# Patient Record
Sex: Female | Born: 1985 | Race: Black or African American | Hispanic: No | Marital: Single | Smoking: Never smoker
Health system: Southern US, Community
[De-identification: ages and names within clinical notes are randomized; demographics above are authoritative.]

## PROBLEM LIST (undated history)

## (undated) DIAGNOSIS — N879 Dysplasia of cervix uteri, unspecified: Secondary | ICD-10-CM

## (undated) DIAGNOSIS — K219 Gastro-esophageal reflux disease without esophagitis: Secondary | ICD-10-CM

## (undated) DIAGNOSIS — O039 Complete or unspecified spontaneous abortion without complication: Secondary | ICD-10-CM

## (undated) DIAGNOSIS — J45909 Unspecified asthma, uncomplicated: Secondary | ICD-10-CM

## (undated) DIAGNOSIS — F32A Depression, unspecified: Secondary | ICD-10-CM

## (undated) DIAGNOSIS — Z8719 Personal history of other diseases of the digestive system: Secondary | ICD-10-CM

## (undated) DIAGNOSIS — D649 Anemia, unspecified: Secondary | ICD-10-CM

## (undated) DIAGNOSIS — Z8711 Personal history of peptic ulcer disease: Secondary | ICD-10-CM

## (undated) DIAGNOSIS — IMO0001 Reserved for inherently not codable concepts without codable children: Secondary | ICD-10-CM

## (undated) DIAGNOSIS — F329 Major depressive disorder, single episode, unspecified: Secondary | ICD-10-CM

## (undated) DIAGNOSIS — I8290 Acute embolism and thrombosis of unspecified vein: Secondary | ICD-10-CM

## (undated) HISTORY — DX: Depression, unspecified: F32.A

## (undated) HISTORY — DX: Anemia, unspecified: D64.9

## (undated) HISTORY — DX: Reserved for inherently not codable concepts without codable children: IMO0001

## (undated) HISTORY — DX: Dysplasia of cervix uteri, unspecified: N87.9

## (undated) HISTORY — DX: Complete or unspecified spontaneous abortion without complication: O03.9

## (undated) HISTORY — PX: DILATION AND CURETTAGE OF UTERUS: SHX78

## (undated) HISTORY — DX: Major depressive disorder, single episode, unspecified: F32.9

## (undated) HISTORY — DX: Acute embolism and thrombosis of unspecified vein: I82.90

## (undated) HISTORY — DX: Gastro-esophageal reflux disease without esophagitis: K21.9

## (undated) HISTORY — PX: INDUCED ABORTION: SHX677

---

## 2015-06-04 DIAGNOSIS — R87612 Low grade squamous intraepithelial lesion on cytologic smear of cervix (LGSIL): Secondary | ICD-10-CM | POA: Insufficient documentation

## 2017-04-09 ENCOUNTER — Emergency Department (HOSPITAL_COMMUNITY)
Admission: EM | Admit: 2017-04-09 | Discharge: 2017-04-09 | Disposition: A | Discharge status: Home / Self Care | Payer: Self-pay | Attending: Emergency Medicine | Admitting: Emergency Medicine

## 2017-04-09 ENCOUNTER — Other Ambulatory Visit: Payer: Self-pay

## 2017-04-09 ENCOUNTER — Encounter (HOSPITAL_COMMUNITY): Payer: Self-pay

## 2017-04-09 DIAGNOSIS — R490 Dysphonia: Secondary | ICD-10-CM | POA: Insufficient documentation

## 2017-04-09 DIAGNOSIS — Z79899 Other long term (current) drug therapy: Secondary | ICD-10-CM | POA: Insufficient documentation

## 2017-04-09 DIAGNOSIS — J45909 Unspecified asthma, uncomplicated: Secondary | ICD-10-CM | POA: Insufficient documentation

## 2017-04-09 HISTORY — DX: Unspecified asthma, uncomplicated: J45.909

## 2017-04-09 MED ORDER — PANTOPRAZOLE SODIUM 20 MG PO TBEC: 20.0000 mg | DELAYED_RELEASE_TABLET | Freq: Every day | ORAL | 0 refills | Status: DC

## 2017-04-09 NOTE — Discharge Instructions (Signed)
[facility] to establish primary care evaluation.  Follow-up with referred GI doctor for further evaluation and management of your GERD.  Take the Protonix as directed for GERD.  Return the emergency department for any fevers, difficulty breathing, difficulty swallowing, swelling of her neck or face or any other worsening or concerning symptoms.

## 2017-04-09 NOTE — ED Triage Notes (Signed)
Pt reports her voice has been going in and out for 3 weeks. Pt denies cough or illness. Reports she had one sharp pain in her throat but no sore throat. No redness or swelling noted. Airway intact.

## 2017-04-09 NOTE — ED Notes (Signed)
Declined W/C at D/C and was escorted to lobby by RN. 

## 2017-04-09 NOTE — ED Provider Notes (Signed)
[facility] EMERGENCY [facility]PART[facility] Provider Note   CSN: [id number] Arrival date & time: 04/09/17  1207     History   Chief Complaint Chief Complaint  Patient presents with  . Hoarse    HPI Makayla Russell is a 31 y.o. female who presents today for evaluation of intermittent hoarseness that has been ongoing for the last 2-3 weeks.  Patient reports that he will occasionally get better but then returned.  Patient denies any pain associated with the symptoms.  She denies any preceding nasal congestion, rhinorrhea, sore throat.  Patient reports that she did have an episode of throat pain today but states that that has resolved.  She has been able to eat and drink without any difficulty.  She is been able to tolerate her secretions without any difficulty.  Patient denies any fevers, recent weight loss, night sweats, difficulty swallowing, neck swelling, difficulty breathing.  Patient does have a history of GERD.  She was instructed to follow-up with GI for management of it but states that she never went and saw GI.  She is not on any medications for GERD.  The history is provided by the patient.    Past Medical History:  Diagnosis Date  . Asthma     There are no active problems to display for this patient.   History reviewed. No pertinent surgical history.  OB History    No data available       Home Medications    Prior to Admission medications   Medication Sig Start Date End Date Taking? Authorizing Provider  Throat Lozenges (COUGH DROPS MENTHOL MT) Use as directed 1-4 drops in the mouth or throat daily as needed (loss of voice).    Yes [provider]  pantoprazole (PROTONIX) 20 MG tablet Take 1 tablet (20 mg total) by mouth daily. 04/09/17 05/09/17  Maxwell CaulLayden, Lindsey A, PA-C    Family History History reviewed. No pertinent family history.  Social History Social History   Tobacco Use  . Smoking status: Never Smoker  . Smokeless tobacco: Never Used    Substance Use Topics  . Alcohol use: No    Frequency: Never  . Drug use: Not on file     Allergies   Patient has no known allergies.   Review of Systems Review of Systems  Constitutional: Negative for diaphoresis, fever and unexpected weight change.  HENT: Positive for voice change. Negative for facial swelling, sore throat and trouble swallowing.   Cardiovascular: Negative for chest pain.  Gastrointestinal: Negative for abdominal pain, nausea and vomiting.     Physical Exam Updated Vital Signs BP 134/71 (BP Location: Right Arm)   Pulse 89   Temp 98.7 F (37.1 C) (Oral)   Resp 16   LMP 03/24/2017 (Within Days)   SpO2 100%   Physical Exam  Constitutional: She appears well-developed and well-nourished.  HENT:  Head: Normocephalic and atraumatic.  Mouth/Throat: Uvula is midline, oropharynx is clear and moist and mucous membranes are normal. No trismus in the jaw.  Posterior oropharynx is clear.  No trismus.  Uvula is midline.  Airway is patent.  Very slight hoarseness on phonation  Eyes: Conjunctivae and EOM are normal. Right eye exhibits no discharge. Left eye exhibits no discharge. No scleral icterus.  Neck: Trachea normal and normal range of motion. Neck supple. No tracheal deviation and no edema present. No thyroid mass present.  No neck mass or swelling appreciated.  No abnormalities with swallowing.  Cardiovascular: Normal rate, regular rhythm and  normal pulses.  Pulmonary/Chest: Effort normal and breath sounds normal.  No evidence of respiratory distress. Able to speak in full sentences without difficulty.  Lymphadenopathy:    She has no cervical adenopathy.       Right: No supraclavicular adenopathy present.       Left: No supraclavicular adenopathy present.  Neurological: She is alert.  Skin: Skin is warm and dry.  Psychiatric: She has a normal mood and affect. Her speech is normal and behavior is normal.  Nursing note and vitals reviewed.    ED Treatments  / Results  Labs (all labs ordered are listed, but only abnormal results are displayed) Labs Reviewed - No data to display  EKG  EKG Interpretation None       Radiology No results found.  Procedures Procedures (including critical care time)  Medications Ordered in ED Medications - No data to display   Initial Impression / Assessment and Plan / ED Course  I have reviewed the triage vital signs and the nursing notes.  Pertinent labs & imaging results that were available during my care of the patient were reviewed by me and considered in my medical decision making (see chart for details).     31 year old female who presents for evaluation of 2-3 weeks of intermittent voice hoarseness.  States that symptoms come and go.  No associated fevers, nasal congestion, upper respiratory symptoms, difficulty breathing, difficulty swallowing.  Had one episode of pain in her throat today but otherwise denies any sore throat.  No history of fevers, night sweats, recent weight loss.  Patient does have a history of GERD for which she had [facility].  She was instructed to follow-up with a GI doctor for evaluation of symptoms but states that she never saw them.  She is not on any medications for GERD symptoms.  She states that she has been having intermittent GERD symptoms for the last several months. Patient is afebrile, non-toxic appearing, sitting comfortably on examination table. Vital signs reviewed and stable.  No evidence of respiratory distress.  O2 sats are 100% on room air.  No evidence of neck mass or swelling.  No evidence of thyroid mass.  Suspect that symptoms might be secondary to GERD and subtherapeutic management of symptoms.  History/physical exam is not concerning for neck mass, Ludwig angina, peritonsillar abscess..  Also consider thyroid issues.  No indication for acute emergent imaging at this time.  Plan to start patient on a PPI for symptomatic relief of GERD.  We will  plan to give outpatient GI follow-up.  Also instructed patient to establish primary care follow-up for evaluation of potential thyroid issues. Patient had ample opportunity for questions and discussion. All patient's questions were answered with full understanding. Strict return precautions discussed. Patient expresses understanding and agreement to plan.     Final Clinical Impressions(s) / ED Diagnoses   Final diagnoses:  Hoarseness of voice    ED Discharge Orders        Ordered    pantoprazole (PROTONIX) 20 MG tablet  Daily     04/09/17 1309       Rosana HoesLayden, Lindsey A, PA-C 04/10/17 1127    Margarita Grizzleay, Danielle, MD 04/10/17 1240

## 2017-04-27 ENCOUNTER — Encounter: Payer: Self-pay | Admitting: Gastroenterology

## 2017-06-14 ENCOUNTER — Ambulatory Visit: Payer: Self-pay | Admitting: Gastroenterology

## 2017-07-23 ENCOUNTER — Other Ambulatory Visit: Payer: Self-pay

## 2017-07-23 ENCOUNTER — Encounter (HOSPITAL_COMMUNITY): Payer: Self-pay | Admitting: Emergency Medicine

## 2017-07-23 DIAGNOSIS — J45909 Unspecified asthma, uncomplicated: Secondary | ICD-10-CM | POA: Insufficient documentation

## 2017-07-23 DIAGNOSIS — R1010 Upper abdominal pain, unspecified: Secondary | ICD-10-CM | POA: Insufficient documentation

## 2017-07-23 LAB — URINALYSIS, ROUTINE W REFLEX MICROSCOPIC
Bilirubin Urine: NEGATIVE
Glucose, UA: NEGATIVE mg/dL
Hgb urine dipstick: NEGATIVE
Ketones, ur: NEGATIVE mg/dL
Leukocytes, UA: NEGATIVE
Nitrite: NEGATIVE
Protein, ur: NEGATIVE mg/dL
Specific Gravity, Urine: 1.027 (ref 1.005–1.030)
pH: 5 (ref 5.0–8.0)

## 2017-07-23 LAB — COMPREHENSIVE METABOLIC PANEL
ALT: 9 U/L — ABNORMAL LOW (ref 14–54)
AST: 19 U/L (ref 15–41)
Albumin: 4.1 g/dL (ref 3.5–5.0)
Alkaline Phosphatase: 48 U/L (ref 38–126)
Anion gap: 9 (ref 5–15)
BUN: 7 mg/dL (ref 6–20)
CO2: 22 mmol/L (ref 22–32)
Calcium: 9.3 mg/dL (ref 8.9–10.3)
Chloride: 108 mmol/L (ref 101–111)
Creatinine, Ser: 0.79 mg/dL (ref 0.44–1.00)
GFR calc Af Amer: >60 mL/min (ref >60)
GFR calc non Af Amer: >60 mL/min (ref >60)
Glucose, Bld: 95 mg/dL (ref 65–99)
Potassium: 3.9 mmol/L (ref 3.5–5.1)
Sodium: 139 mmol/L (ref 135–145)
Total Bilirubin: 0.6 mg/dL (ref 0.3–1.2)
Total Protein: 7.9 g/dL (ref 6.5–8.1)

## 2017-07-23 LAB — I-STAT BETA HCG BLOOD, ED (MC, WL, AP ONLY): I-stat hCG, quantitative: <5 m[IU]/mL (ref <5)

## 2017-07-23 LAB — CBC
HCT: 33.1 % — ABNORMAL LOW (ref 36.0–46.0)
Hemoglobin: 10.8 g/dL — ABNORMAL LOW (ref 12.0–15.0)
MCH: 29.4 pg (ref 26.0–34.0)
MCHC: 32.6 g/dL (ref 30.0–36.0)
MCV: 90.2 fL (ref 78.0–100.0)
Platelets: 342 10*3/uL (ref 150–400)
RBC: 3.67 MIL/uL — ABNORMAL LOW (ref 3.87–5.11)
RDW: 15 % (ref 11.5–15.5)
WBC: 5.7 10*3/uL (ref 4.0–10.5)

## 2017-07-23 LAB — LIPASE, BLOOD: Lipase: 36 U/L (ref 11–51)

## 2017-07-23 NOTE — ED Triage Notes (Signed)
The patient said she has been having abdominal pain that is also on her side.  The patient said she has also been having acid reflux or burning in her stomach.  She has had nausea but no vomiting.  She has not taken anything for it and it is getting worse.  Patient denies urinary symptoms.  She rates her pain 10/10.

## 2017-07-24 ENCOUNTER — Emergency Department (HOSPITAL_COMMUNITY): Payer: Self-pay

## 2017-07-24 ENCOUNTER — Emergency Department (HOSPITAL_COMMUNITY)
Admission: EM | Admit: 2017-07-24 | Discharge: 2017-07-24 | Disposition: A | Discharge status: Home / Self Care | Payer: Self-pay | Attending: Emergency Medicine | Admitting: Emergency Medicine

## 2017-07-24 DIAGNOSIS — R1010 Upper abdominal pain, unspecified: Secondary | ICD-10-CM

## 2017-07-24 HISTORY — DX: Personal history of peptic ulcer disease: Z87.11

## 2017-07-24 HISTORY — DX: Personal history of other diseases of the digestive system: Z87.19

## 2017-07-24 LAB — WET PREP, GENITAL
Sperm: NONE SEEN
Trich, Wet Prep: NONE SEEN
Yeast Wet Prep HPF POC: NONE SEEN

## 2017-07-24 MED ORDER — GI COCKTAIL ~~LOC~~
30.0000 mL | Freq: Once | ORAL | Status: AC
  Administered 2017-07-24: 30 mL via ORAL
  Filled 2017-07-24: qty 30

## 2017-07-24 MED ORDER — OMEPRAZOLE 20 MG PO CPDR: 20.0000 mg | DELAYED_RELEASE_CAPSULE | Freq: Every day | ORAL | 0 refills | Status: DC

## 2017-07-24 MED ORDER — IOPAMIDOL (ISOVUE-300) INJECTION 61%
INTRAVENOUS | Status: AC
  Administered 2017-07-24: 100 mL
  Filled 2017-07-24: qty 100

## 2017-07-24 MED ORDER — SUCRALFATE 1 G PO TABS: 1.0000 g | ORAL_TABLET | Freq: Three times a day (TID) | ORAL | 0 refills | Status: DC

## 2017-07-24 NOTE — Discharge Instructions (Signed)
Follow-up with the stomach doctor as scheduled.  Avoid alcohol, NSAIDs, caffeine, spicy foods.  Take the stomach medications as prescribed.  Return to the ED if you develop new or worsening symptoms.

## 2017-07-24 NOTE — ED Provider Notes (Signed)
[facility] Provider Note   CSN: [id number] Arrival date & time: 07/23/17  2105     History   Chief Complaint Chief Complaint  Patient presents with  . Abdominal Pain    HPI Makayla Russell is a 32 y.o. female.  Patient presents with left-sided burning abdominal pain that has been ongoing for several months.  She reports the pain is constant and does not go away except for brief episodes at a time.  She has been seen for this pain multiple times at [facility] and told it was related to her acid reflux disease.  She states she was giv Protonix but that has not helped.  She has had nausea but no vomiting.  She came in tonight because she feels that the left-sided burning abdominal pain is progressively worsening and making her uncomfortable where she cannot do anything throughout the day.  She still wants to eat.  Her bowel movements have been normal.  She denies any pain with urination or blood in the urine.  No vaginal bleeding or discharge.  No previous abdominal surgeries.  She reports she had [place] in [place] several years ago that showed "healed ulcers".  She is due to see gastroenterology on the 25th of this month.   The history is provided by the patient.  Abdominal Pain   Associated symptoms include nausea. Pertinent negatives include fever, vomiting, dysuria, hematuria, arthralgias and myalgias.    Past Medical History:  Diagnosis Date  . Asthma   . History of stomach ulcers     There are no active problems to display for this patient.   History reviewed. No pertinent surgical history.  OB History    No data available       Home Medications    Prior to Admission medications   Medication Sig Start Date End Date Taking? Authorizing Provider  pantoprazole (PROTONIX) 20 MG tablet Take 1 tablet (20 mg total) by mouth daily. 04/09/17 05/09/17  Graciella FreerLayden, Lindsey A, PA-C  Throat Lozenges (COUGH DROPS MENTHOL MT) Use as directed 1-4 drops in  the mouth or throat daily as needed (loss of voice).     [provider]    Family History History reviewed. No pertinent family history.  Social History Social History   Tobacco Use  . Smoking status: Never Smoker  . Smokeless tobacco: Never Used  Substance Use Topics  . Alcohol use: No    Frequency: Never  . Drug use: No     Allergies   Patient has no known allergies.   Review of Systems Review of Systems  Constitutional: Positive for appetite change. Negative for fever.  HENT: Negative for congestion and rhinorrhea.   Eyes: Negative for visual disturbance.  Respiratory: Negative for cough, chest tightness and shortness of breath.   Cardiovascular: Negative for chest pain.  Gastrointestinal: Positive for abdominal pain and nausea. Negative for vomiting.  Genitourinary: Negative for dysuria, hematuria, vaginal bleeding and vaginal discharge.  Musculoskeletal: Negative for arthralgias and myalgias.  Skin: Negative for rash.  Neurological: Negative for dizziness, weakness and light-headedness.    all other systems are negative except as noted in the HPI and PMH.    Physical Exam Updated Vital Signs BP 126/71 (BP Location: Right Arm)   Pulse 83   Temp 98.5 F (36.9 C) (Oral)   Resp 16   LMP 07/02/2017   SpO2 100%   Physical Exam  Constitutional: She is oriented to person, place, and time. She appears  well-developed and well-nourished. No distress.  HENT:  Head: Normocephalic and atraumatic.  Mouth/Throat: Oropharynx is clear and moist. No oropharyngeal exudate.  Eyes: Conjunctivae and EOM are normal. Pupils are equal, round, and reactive to light.  Neck: Normal range of motion. Neck supple.  No meningismus.  Cardiovascular: Normal rate, regular rhythm, normal heart sounds and intact distal pulses.  No murmur heard. Pulmonary/Chest: Effort normal and breath sounds normal. No respiratory distress.  Abdominal: Soft. There is tenderness. There is no  rebound and no guarding.  Mild left upper and lower quadrant tenderness without voluntary guarding.  Abdomen is soft  Genitourinary:  Genitourinary Comments: Chaperone present.  Normal external genitalia.  Cervix is closed.  No CMT.  No lateralizing adnexal tenderness.  No midline uterine tenderness  Musculoskeletal: Normal range of motion. She exhibits no edema or tenderness.  No CVA tenderness  Neurological: She is alert and oriented to person, place, and time. No cranial nerve deficit. She exhibits normal muscle tone. Coordination normal.  No ataxia on finger to nose bilaterally. No pronator drift. 5/5 strength throughout. CN 2-12 intact.Equal grip strength. Sensation intact.   Skin: Skin is warm.  Psychiatric: She has a normal mood and affect. Her behavior is normal.  Nursing note and vitals reviewed.    ED Treatments / Results  Labs (all labs ordered are listed, but only abnormal results are displayed) Labs Reviewed  WET PREP, GENITAL - Abnormal; Notable for the following components:      Result Value   Clue Cells Wet Prep HPF POC PRESENT (*)    WBC, Wet Prep HPF POC MANY (*)    All other components within normal limits  COMPREHENSIVE METABOLIC PANEL - Abnormal; Notable for the following components:   ALT 9 (*)    All other components within normal limits  CBC - Abnormal; Notable for the following components:   RBC 3.67 (*)    Hemoglobin 10.8 (*)    HCT 33.1 (*)    All other components within normal limits  LIPASE, BLOOD  URINALYSIS, ROUTINE W REFLEX MICROSCOPIC  I-STAT BETA HCG BLOOD, ED ([facility], [facility], [facility] ONLY)  GC/CHLAMYDIA PROBE AMP ([place]) NOT AT [facility]    EKG  EKG Interpretation None       Radiology Ct Abdomen Pelvis W Contrast  Result Date: 07/24/2017 CLINICAL DATA:  Acute abdominal pain. EXAM: CT ABDOMEN AND PELVIS WITH CONTRAST TECHNIQUE: Multidetector CT imaging of the abdomen and pelvis was performed using the standard protocol following bolus  administration of intravenous contrast. CONTRAST:  ISOVUE-300 IOPAMIDOL (ISOVUE-300) INJECTION 61% COMPARISON:  None. FINDINGS: Lower chest: The lung bases are clear. Hepatobiliary: No focal liver abnormality is seen. No gallstones, gallbladder wall thickening, or biliary dilatation. Pancreas: No ductal dilatation or inflammation. Spleen: Normal in size without focal abnormality. Adrenals/Urinary Tract: Adrenal glands are unremarkable. Kidneys are normal, without renal calculi, focal lesion, or hydronephrosis. Bladder is unremarkable. Stomach/Bowel: Bowel evaluation is limited in the absence of enteric contrast and paucity of intra-abdominal fat. The stomach is nondistended. No bowel obstruction or evidence of inflammation. Air-filled normal appendix visualized in the right lower abdomen, for example image 59 series 3. Vascular/Lymphatic: Prominent periuterine and adnexal vascularity with dilatation of the ovarian veins. Normal caliber abdominal aorta. Limited sensitivity for adenopathy given lack of enteric contrast and paucity of body fat, no bulky adenopathy. Reproductive: Prominent uterine and adnexal vascularity with dilatation of the ovarian veins. Probable corpus luteal cyst in the right ovary. Small volume of free fluid in  the pelvis may be physiologic. Other: No free air or intra-abdominal abscess. Musculoskeletal: There are no acute or suspicious osseous abnormalities. IMPRESSION: 1. 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 2. Otherwise no acute abnormality. Electronically Signed   By: Rubye Oaks M.D.   On: 07/24/2017 02:38    Procedures Procedures (including critical care time)  Medications Ordered in ED Medications  gi cocktail (Maalox,Lidocaine,Donnatal) (not administered)     Initial Impression / Assessment and Plan / ED Course  I have reviewed the triage vital signs and the nursing  notes.  Pertinent labs & imaging results that were available during my care of the patient were reviewed by me and considered in my medical decision making (see chart for details).       Patient with several month history of left-sided burning abdominal pain with nausea.  She has been seen at outside hospitals multiple times for this without diagnosis.  Her labs today are reassuring but does show anemia.  She is not pregnant.  LFTs and lipase are normal.  Urinalysis is negative.  Record review shows patient had a CT scan done in May 2018 at [facility] that showed right ovarian thrombus.  Patient was seen by gynecology and treated for PID.  She has not since followed up.  She denies any right-sided abdominal pain today.  Labs reassuring. Anemia stable from previous values. CT today reassuring. Pelvic congestion syndrome noted but pelvic exam is benign. BV noted but patient without symptoms.  Discussed PPI, carafate, GI followup. Avoid alcohol, NSAIDs, caffeine, spicy foods. Return precautions discussed. Final Clinical Impressions(s) / ED Diagnoses   Final diagnoses:  Upper abdominal pain    ED Discharge Orders    None       Donyelle Enyeart, Jeannett Senior, MD 07/24/17 [phone]

## 2017-07-24 NOTE — ED Notes (Signed)
Patient transported to CT 

## 2017-07-24 NOTE — ED Notes (Signed)
ED Provider at bedside. 

## 2017-07-25 ENCOUNTER — Encounter: Payer: Self-pay | Admitting: Gastroenterology

## 2017-07-25 LAB — GC/CHLAMYDIA PROBE AMP (~~LOC~~) NOT AT ARMC
Chlamydia: NEGATIVE
Neisseria Gonorrhea: NEGATIVE

## 2017-07-28 ENCOUNTER — Encounter: Payer: Self-pay | Admitting: Gastroenterology

## 2017-08-01 ENCOUNTER — Encounter: Payer: Self-pay | Admitting: Gastroenterology

## 2017-08-01 ENCOUNTER — Ambulatory Visit (INDEPENDENT_AMBULATORY_CARE_PROVIDER_SITE_OTHER): Payer: Self-pay | Admitting: Gastroenterology

## 2017-08-01 VITALS — BP 104/70 | HR 84 | Ht 66.25 in | Wt 132.0 lb

## 2017-08-01 DIAGNOSIS — R1310 Dysphagia, unspecified: Secondary | ICD-10-CM

## 2017-08-01 DIAGNOSIS — R1012 Left upper quadrant pain: Secondary | ICD-10-CM

## 2017-08-01 DIAGNOSIS — R110 Nausea: Secondary | ICD-10-CM

## 2017-08-01 DIAGNOSIS — R490 Dysphonia: Secondary | ICD-10-CM

## 2017-08-01 MED ORDER — ESOMEPRAZOLE MAGNESIUM 40 MG PO PACK: 40.0000 mg | PACK | Freq: Every day | ORAL | 3 refills | Status: DC

## 2017-08-01 NOTE — ED Notes (Signed)
08/01/2017, Spoke with pt. Concerning her STD results and wet prep results. All Questions answered.  Informed pt. If she had developed any symptoms, to come back to ED for Evaluation or follow-up with her own PCP.  Pt. Verbalized understanding .

## 2017-08-01 NOTE — Progress Notes (Signed)
Makayla Russell    [id number]    Nov 14, 1985  Primary Care Physician:Patient, No Pcp Per  Referring Physician: No referring provider defined for this encounter.  Chief complaint:  LUQ abdominal pain, Nausea, hoarseness of voice  HPI: 32 year old African-American female is here with complaints of  left upper quadrant pain associated with nausea and hoarseness of voice.  She has been having severe abdominal pain since 2018 on and off, no improvement with Prilosec or carafate. She feels they make symptoms worse, when she swallows the pills it burns more. She has associated hoarseness of voice and nausea. Pain is predominantly in LUQ of abd. She says that she has a "weird sensation with swallowing", patient was unable to elaborate more, denied food getting hung up but has burning sensation with swallowing. She is eating less and also thinks she has lost weight. No black stool or blood per rectum. She has irregular bowel habits, has BM approx once every 2 days. [facility] , based on chart review she presented to ER 5 times within in the past 15 months with similar complaints.  Most recent ER visit July 24, 2017 with complaints of left upper quadrant abdominal pain. [place] ?showed gastric ulcers. She was treated for H.pylori at the time.  Anemia post pregnancy is on Iron pills   Outpatient Encounter Medications as of 08/01/2017  Medication Sig  . albuterol (PROVENTIL HFA;VENTOLIN HFA) 108 (90 Base) MCG/ACT inhaler Inhale 1-2 puffs into the lungs every 6 (six) hours as needed for wheezing or shortness of breath.  Marland Kitchen. omeprazole (PRILOSEC) 20 MG capsule Take 1 capsule (20 mg total) by mouth daily.  . sucralfate (CARAFATE) 1 g tablet Take 1 tablet (1 g total) by mouth 4 (four) times daily -  with meals and at bedtime.  . Throat Lozenges (COUGH DROPS MENTHOL MT) Use as directed  1 drop in the mouth or throat daily as needed (loss of voice).    No facility-administered encounter medications on file as of 08/01/2017.     Allergies as of 08/01/2017  . (No Known Allergies)    Past Medical History:  Diagnosis Date  . Asthma   . History of stomach ulcers     No past surgical history on file.  No family history on file.  Social History   Socioeconomic History  . Marital status: Single    Spouse name: Not on file  . Number of children: Not on file  . Years of education: Not on file  . Highest education level: Not on file  Occupational History  . Not on file  Social Needs  . Financial resource strain: Not on file  . Food insecurity:    Worry: Not on file    Inability: Not on file  . Transportation needs:    Medical: Not on file    Non-medical: Not on file  Tobacco Use  . Smoking status: Never Smoker  . Smokeless tobacco: Never Used  Substance and Sexual Activity  . Alcohol use: No    Frequency: Never  . Drug use: No  . Sexual activity: Not on file  Lifestyle  . Physical activity:    Days per week: Not on file    Minutes per session: Not on file  . Stress: Not on file  Relationships  . Social connections:    Talks  on phone: Not on file    Gets together: Not on file    Attends religious service: Not on file    Active member of club or organization: Not on file    Attends meetings of clubs or organizations: Not on file    Relationship status: Not on file  . Intimate partner violence:    Fear of current or ex partner: Not on file    Emotionally abused: Not on file    Physically abused: Not on file    Forced sexual activity: Not on file  Other Topics Concern  . Not on file  Social History Narrative  . Not on file      Review of systems: Review of Systems  Constitutional: Negative for fever and chills. Positive for fatigue HENT: Negative.   Eyes: Negative for blurred vision.  Respiratory: Negative for cough, shortness of breath and  wheezing.   Cardiovascular: Negative for chest pain and palpitations.  Gastrointestinal: as per HPI Genitourinary: Negative for dysuria, urgency, frequency and hematuria.  Musculoskeletal: Positive for myalgias, back pain and joint pain.  Skin: Negative for itching and occasional rash associated with dry skin.  Neurological: Negative for dizziness, tremors, focal weakness, seizures and loss of consciousness. Positive for headaches Endo/Heme/Allergies: Positive for seasonal allergies.  Psychiatric/Behavioral: Negative for depression, suicidal ideas and hallucinations. Positive for depression All other systems reviewed and are negative.   Physical Exam: Vitals:   08/01/17 1336  BP: 104/70  Pulse: 84   Body mass index is 21.14 kg/m. Gen:      No acute distress HEENT:  EOMI, sclera anicteric Neck:     No masses; no thyromegaly Lungs:    Clear to auscultation bilaterally; normal respiratory effort CV:         Regular rate and rhythm; no murmurs Abd:      + bowel sounds; soft, non-tender; no palpable masses, no distension Ext:    No edema; adequate peripheral perfusion Skin:      Warm and dry; no rash Neuro: alert and oriented x 3 Psych: normal mood and affect  Data Reviewed:  Reviewed labs, radiology imaging, old records and pertinent past GI work up  CT abdomen pelvis with contrast July 24, 2017 FINDINGS: Lower chest: The lung bases are clear.  Hepatobiliary: No focal liver abnormality is seen. No gallstones, gallbladder wall thickening, or biliary dilatation.  Pancreas: No ductal dilatation or inflammation.  Spleen: Normal in size without focal abnormality.  Adrenals/Urinary Tract: Adrenal glands are unremarkable. Kidneys are normal, without renal calculi, focal lesion, or hydronephrosis. Bladder is unremarkable.  Stomach/Bowel: Bowel evaluation is limited in the absence of enteric contrast and paucity of intra-abdominal fat. The stomach is nondistended. No  bowel obstruction or evidence of inflammation. Air-filled normal appendix visualized in the right lower abdomen, for example image 59 series 3.  Vascular/Lymphatic: Prominent periuterine and adnexal vascularity with dilatation of the ovarian veins. Normal caliber abdominal aorta. Limited sensitivity for adenopathy given lack of enteric contrast and paucity of body fat, no bulky adenopathy.  Reproductive: Prominent uterine and adnexal vascularity with dilatation of the ovarian veins. Probable corpus luteal cyst in the right ovary. Small volume of free fluid in the pelvis may be physiologic.  Other: No free air or intra-abdominal abscess.  Musculoskeletal: There are no acute or suspicious osseous abnormalities.  IMPRESSION: 1. 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 2. Otherwise no acute abnormality.  CT abdomen and  pelvis Sep 28, 2016 done at [facility] Inflammatory appearing findings in the lower pelvis favored to represent pelvic inflammatory disease or similar process.  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  Will switch to Nexium powder packet 40 mg daily instead of capsule as she has more burning sensation in her throat when she swallows capsules Schedule for EGD for evaluation The risks and benefits as well as alternatives of endoscopic procedure(s) have been discussed and reviewed. All questions answered. The patient agrees to proceed.    Iona Beard , MD [phone] Mon-Fri 8a-5p [phone] after 5p, weekends, holidays  CC: No ref. provider found

## 2017-08-01 NOTE — Patient Instructions (Signed)
If you are age 33 or older, your body mass index should be between 23-30. Your Body mass index is 21.14 kg/m. If this is out of the aforementioned range listed, please consider follow up with your Primary Care Provider.  If you are age 33 or younger, your body mass index should be between 19-25. Your Body mass index is 21.14 kg/m. If this is out of the aformentioned range listed, please consider follow up with your Primary Care Provider.   We have sent the following medications to your pharmacy for you to pick up at your convenience: Nexium 40mg  packet by mouth once daily.   It has been recommended to you by your physician that you have a(n) endoscopy completed. Per your request, we did not schedule the procedure(s) today due to financial assistance form needing to be filled out and returned . Please contact our office at [phone] should you decide to have the procedure completed.  Please follow up with your gynecologist as recommended.

## 2017-08-01 NOTE — ED Notes (Signed)
Pt. Called and left a message on this RN's voicemail concerning test results.  Called the pt. Back and left a message for a return call. 08/01/2017, 13:42

## 2017-08-09 ENCOUNTER — Encounter: Payer: Self-pay | Admitting: Gastroenterology

## 2017-08-22 ENCOUNTER — Ambulatory Visit (INDEPENDENT_AMBULATORY_CARE_PROVIDER_SITE_OTHER): Payer: Self-pay | Admitting: Nurse Practitioner

## 2017-08-22 ENCOUNTER — Other Ambulatory Visit: Payer: Self-pay

## 2017-08-22 ENCOUNTER — Encounter (INDEPENDENT_AMBULATORY_CARE_PROVIDER_SITE_OTHER): Payer: Self-pay | Admitting: Nurse Practitioner

## 2017-08-22 VITALS — BP 117/71 | HR 85 | Temp 98.1°F | Ht 66.25 in | Wt 132.6 lb

## 2017-08-22 DIAGNOSIS — B9689 Other specified bacterial agents as the cause of diseases classified elsewhere: Secondary | ICD-10-CM

## 2017-08-22 DIAGNOSIS — R1012 Left upper quadrant pain: Secondary | ICD-10-CM

## 2017-08-22 DIAGNOSIS — N760 Acute vaginitis: Secondary | ICD-10-CM

## 2017-08-22 MED ORDER — METRONIDAZOLE 500 MG PO TABS: 500.0000 mg | ORAL_TABLET | Freq: Two times a day (BID) | ORAL | 0 refills | Status: AC

## 2017-08-22 NOTE — Patient Instructions (Signed)
Food Choices for Gastroesophageal Reflux Disease, Adult When you have gastroesophageal reflux disease (GERD), the foods you eat and your eating habits are very important. Choosing the right foods can help ease your discomfort. What guidelines do I need to follow?  Choose fruits, vegetables, whole grains, and low-fat dairy products.  Choose low-fat meat, fish, and poultry.  Limit fats such as oils, salad dressings, butter, nuts, and avocado.  Keep a food diary. This helps you identify foods that cause symptoms.  Avoid foods that cause symptoms. These may be different for everyone.  Eat small meals often instead of 3 large meals a day.  Eat your meals slowly, in a place where you are relaxed.  Limit fried foods.  Cook foods using methods other than frying.  Avoid drinking alcohol.  Avoid drinking large amounts of liquids with your meals.  Avoid bending over or lying down until 2-3 hours after eating. What foods are not recommended? These are some foods and drinks that may make your symptoms worse: Vegetables Tomatoes. Tomato juice. Tomato and spaghetti sauce. Chili peppers. Onion and garlic. Horseradish. Fruits Oranges, grapefruit, and lemon (fruit and juice). Meats High-fat meats, fish, and poultry. This includes hot dogs, ribs, ham, sausage, salami, and bacon. Dairy Whole milk and chocolate milk. Sour cream. Cream. Butter. Ice cream. Cream cheese. Drinks Coffee and tea. Bubbly (carbonated) drinks or energy drinks. Condiments Hot sauce. Barbecue sauce. Sweets/Desserts Chocolate and cocoa. Donuts. Peppermint and spearmint. Fats and Oils High-fat foods. This includes [place] fries and potato chips. Other Vinegar. Strong spices. This includes black pepper, white pepper, red pepper, cayenne, curry powder, cloves, ginger, and chili powder. The items listed above may not be a complete list of foods and drinks to avoid. Contact your dietitian for more information. This  information is not intended to replace advice given to you by your health care provider. Make sure you discuss any questions you have with your health care provider. Document Released: 10/26/2011 Document Revised: 10/02/2015 Document Reviewed: 02/28/2013 Elsevier Interactive Patient Education  2017 Elsevier Inc.  Heartburn Heartburn is a type of pain or discomfort that can happen in the throat or chest. It is often described as a burning pain. It may also cause a bad taste in the mouth. Heartburn may feel worse when you lie down or bend over. It may be caused by stomach contents that move back up (reflux) into the tube that connects the mouth with the stomach (esophagus). Follow these instructions at home: Take these actions to lessen your discomfort and to help avoid problems. Diet  Follow a diet as told by your doctor. You may need to avoid foods and drinks such as: ? Coffee and tea (with or without caffeine). ? Drinks that contain alcohol. ? Energy drinks and sports drinks. ? Carbonated drinks or sodas. ? Chocolate and cocoa. ? Peppermint and mint flavorings. ? Garlic and onions. ? Horseradish. ? Spicy and acidic foods, such as peppers, chili powder, curry powder, vinegar, hot sauces, and BBQ sauce. ? Citrus fruit juices and citrus fruits, such as oranges, lemons, and limes. ? Tomato-based foods, such as red sauce, chili, salsa, and pizza with red sauce. ? Fried and fatty foods, such as donuts, french fries, potato chips, and high-fat dressings. ? High-fat meats, such as hot dogs, rib eye steak, sausage, ham, and bacon. ? High-fat dairy items, such as whole milk, butter, and cream cheese.  Eat small meals often. Avoid eating large meals.  Avoid drinking large amounts of liquid with your  meals.  Avoid eating meals during the 2-3 hours before bedtime.  Avoid lying down right after you eat.  Do not exercise right after you eat. General instructions  Pay attention to any changes  in your symptoms.  Take over-the-counter and prescription medicines only as told by your doctor. Do not take aspirin, ibuprofen, or other NSAIDs unless your doctor says it is okay.  Do not use any tobacco products, including cigarettes, chewing tobacco, and e-cigarettes. If you need help quitting, ask your doctor.  Wear loose clothes. Do not wear anything tight around your waist.  Raise (elevate) the head of your bed about 6 inches (15 cm).  Try to lower your stress. If you need help doing this, ask your doctor.  If you are overweight, lose an amount of weight that is healthy for you. Ask your doctor about a safe weight loss goal.  Keep all follow-up visits as told by your doctor. This is important. Contact a doctor if:  You have new symptoms.  You lose weight and you do not know why it is happening.  You have trouble swallowing, or it hurts to swallow.  You have wheezing or a cough that keeps happening.  Your symptoms do not get better with treatment.  You have heartburn often for more than two weeks. Get help right away if:  You have pain in your arms, neck, jaw, teeth, or back.  You feel sweaty, dizzy, or light-headed.  You have chest pain or shortness of breath.  You throw up (vomit) and your throw up looks like blood or coffee grounds.  Your poop (stool) is bloody or black. This information is not intended to replace advice given to you by your health care provider. Make sure you discuss any questions you have with your health care provider. Document Released: 01/06/2011 Document Revised: 10/02/2015 Document Reviewed: 08/21/2014 Elsevier Interactive Patient Education  [employer]2018 Elsevier Inc.

## 2017-08-22 NOTE — Progress Notes (Signed)
Assessment & Plan:  Makayla Russell was seen today for hospitalization follow-up.  Diagnoses and all orders for this visit:  Left upper quadrant pain EGD pending PAP pending  Bacterial vaginosis -     metroNIDAZOLE (FLAGYL) 500 MG tablet; Take 1 tablet (500 mg total) by mouth 2 (two) times daily for 7 days.    Patient has been counseled on age-appropriate routine health concerns for screening and prevention. These are reviewed and up-to-date. Referrals have been placed accordingly. Immunizations are up-to-date or declined.    Subjective:   Chief Complaint  Patient presents with  . Hospitalization Follow-up    upper abdominal pain   HPI Makayla Russell 32 y.o. female presents to office today for hospital follow up.   Abdominal Pain She was seen in the ED on 08-01-2017 for LUQ abdominal pain. She has a history of GERD with ulcers but reportedly protonix did not provide relief of her pain. She has an appointment scheduled with GI on 09-01-2017 and has an endoscopy scheduled. 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  She does report she follow-up with oncology thereafter.  She also states she had a colposcopy due to abnormal Pap a few years ago but no follow-up. CT during ED admission showed pelvic congestion syndrome, right ovarian cyst and labs did show bacterial vaginosis which I will treat patient for today.  She was instructed to continue PPI, Carafate and instructed to follow-up with GI. Today she continues to endorse LUQ pain despite medication compliance. She needs to see GI and have PAP performed. She would like to check with the Health Department first to see if she can get a PAP sooner than what we can schedule here at the clinic.    Review of Systems  Constitutional: Negative for fever, malaise/fatigue and weight loss.  HENT: Negative.  Negative for nosebleeds.   Eyes: Negative.  Negative for blurred vision, double vision and  photophobia.  Respiratory: Negative.  Negative for cough and shortness of breath.   Cardiovascular: Negative.  Negative for chest pain, palpitations and leg swelling.  Gastrointestinal: Positive for abdominal pain. Negative for heartburn, nausea and vomiting.  Musculoskeletal: Negative.  Negative for myalgias.  Neurological: Negative.  Negative for dizziness, focal weakness, seizures and headaches.  Psychiatric/Behavioral: Negative.  Negative for suicidal ideas.    Past Medical History:  Diagnosis Date  . Abortion    x 2  . Anemia   . Asthma   . Clot    in ovary vein 2018  . Depression   . History of stomach ulcers     Past Surgical History:  Procedure Laterality Date  . NO PAST SURGERIES      Family History  Problem Relation Age of Onset  . Hypertension Mother   . Tuberculosis Maternal Grandmother     Social History Reviewed with no changes to be made today.   Outpatient Medications Prior to Visit  Medication Sig Dispense Refill  . albuterol (PROVENTIL HFA;VENTOLIN HFA) 108 (90 Base) MCG/ACT inhaler Inhale 1-2 puffs into the lungs every 6 (six) hours as needed for wheezing or shortness of breath.    . esomeprazole (NEXIUM) 40 MG packet Take 40 mg by mouth daily before breakfast. 30 each 3  . omeprazole (PRILOSEC) 20 MG capsule Take 1 capsule (20 mg total) by mouth daily. 30 capsule 0  . sucralfate (CARAFATE) 1 g tablet Take 1 tablet (1 g total) by mouth 4 (four) times  daily -  with meals and at bedtime. 30 tablet 0  . Throat Lozenges (COUGH DROPS MENTHOL MT) Use as directed 1 drop in the mouth or throat daily as needed (loss of voice).      No facility-administered medications prior to visit.     No Known Allergies     Objective:    BP 117/71 (BP Location: Left Arm, Patient Position: Sitting, Cuff Size: Normal)   Pulse 85   Temp 98.1 F (36.7 C) (Oral)   Ht 5' 6.25" (1.683 m)   Wt 132 lb 9.6 oz (60.1 kg)   LMP 08/22/2017 (Exact Date)   SpO2 100%   BMI 21.24  kg/m  Wt Readings from Last 3 Encounters:  08/22/17 132 lb 9.6 oz (60.1 kg)  08/01/17 132 lb (59.9 kg)    Physical Exam  Constitutional: She is oriented to person, place, and time. She appears well-developed and well-nourished. She is cooperative.  HENT:  Head: Normocephalic and atraumatic.  Eyes: EOM are normal.  Neck: Normal range of motion.  Cardiovascular: Normal rate, regular rhythm and normal heart sounds. Exam reveals no gallop and no friction rub.  No murmur heard. Pulmonary/Chest: Effort normal and breath sounds normal. No tachypnea. No respiratory distress. She has no decreased breath sounds. She has no wheezes. She has no rhonchi. She has no rales. She exhibits no tenderness.  Abdominal: Soft. Bowel sounds are normal. She exhibits no distension and no mass. There is tenderness (LUQ). There is no rebound and no guarding. No hernia.  Musculoskeletal: Normal range of motion. She exhibits no edema.  Neurological: She is alert and oriented to person, place, and time. Coordination normal.  Skin: Skin is warm and dry.  Psychiatric: She has a normal mood and affect. Her behavior is normal. Judgment and thought content normal.  Nursing note and vitals reviewed.     Patient has been counseled extensively about nutrition and exercise as well as the importance of adherence with medications and regular follow-up. The patient was given clear instructions to go to ER or return to medical center if symptoms don't improve, worsen or new problems develop. The patient verbalized understanding.   Follow-up: Return if symptoms worsen or fail to improve.   Claiborne Rigg, FNP-BC [facility] [place], [place] [phone]   08/22/2017, 5:16 PM

## 2017-08-25 ENCOUNTER — Other Ambulatory Visit: Payer: Self-pay

## 2017-08-25 ENCOUNTER — Ambulatory Visit (AMBULATORY_SURGERY_CENTER): Payer: Self-pay | Admitting: *Deleted

## 2017-08-25 VITALS — Ht 66.0 in | Wt 131.0 lb

## 2017-08-25 DIAGNOSIS — R1012 Left upper quadrant pain: Secondary | ICD-10-CM

## 2017-08-25 NOTE — Progress Notes (Signed)
No egg or soy allergy known to patient  No issues with past sedation with any surgeries  or procedures, no intubation problems  No diet pills per patient No home 02 use per patient  No blood thinners per patient  Pt denies issues with constipation  No A fib or A flutter  EMMI video sent to pt's e mail  

## 2017-09-01 ENCOUNTER — Encounter: Payer: Self-pay | Admitting: Gastroenterology

## 2017-09-01 ENCOUNTER — Other Ambulatory Visit: Payer: Self-pay

## 2017-09-01 ENCOUNTER — Ambulatory Visit (AMBULATORY_SURGERY_CENTER): Payer: Self-pay | Admitting: Gastroenterology

## 2017-09-01 VITALS — BP 129/80 | HR 75 | Temp 97.5°F | Resp 11 | Ht 66.0 in | Wt 131.0 lb

## 2017-09-01 DIAGNOSIS — R1012 Left upper quadrant pain: Secondary | ICD-10-CM

## 2017-09-01 DIAGNOSIS — K3189 Other diseases of stomach and duodenum: Secondary | ICD-10-CM

## 2017-09-01 MED ORDER — SODIUM CHLORIDE 0.9 % IV SOLN: 500.0000 mL | Freq: Once | INTRAVENOUS | Status: DC

## 2017-09-01 NOTE — Op Note (Signed)
[facility] Patient Name: Makayla Russell Procedure Date: 09/01/2017 10:14 AM MRN: [id number] Endoscopist: Napoleon FormKavitha V. Ajeet Casasola , MD Age: 32 Referring MD:  Date of Birth: 1985-09-22 Gender: Female Account #: [id number] Procedure:                Upper GI endoscopy Indications:              Epigastric abdominal pain Medicines:                Monitored Anesthesia Care Procedure:                Pre-Anesthesia Assessment:                           - Prior to the procedure, a History and Physical                            was performed, and patient medications and                            allergies were reviewed. The patient's tolerance of                            previous anesthesia was also reviewed. The risks                            and benefits of the procedure and the sedation                            options and risks were discussed with the patient.                            All questions were answered, and informed consent                            was obtained. Prior Anticoagulants: The patient has                            taken no previous anticoagulant or antiplatelet                            agents. ASA Grade Assessment: II - A patient with                            mild systemic disease. After reviewing the risks                            and benefits, the patient was deemed in                            satisfactory condition to undergo the procedure.                           After obtaining informed consent, the endoscope was  passed under direct vision. Throughout the                            procedure, the patient's blood pressure, pulse, and                            oxygen saturations were monitored continuously. The                            Endoscope was introduced through the mouth, and                            advanced to the second part of duodenum. The upper                            GI endoscopy was  accomplished without difficulty.                            The patient tolerated the procedure well. Scope In: Scope Out: Findings:                 The Z-line was regular and was found 38 cm from the                            incisors. Esophagus appeared normal.                           The entire examined stomach was normal. Biopsies                            were taken with a cold forceps for Helicobacter                            pylori testing.                           A few localized erosions without bleeding were                            found in the duodenal bulb. second part of duodenum                            appeared normal. Biopsies were taken with a cold                            forceps for histology. Complications:            No immediate complications. Estimated Blood Loss:     Estimated blood loss was minimal. Impression:               - Z-line regular, 38 cm from the incisors.                           - Normal stomach. Biopsied.                           -  Duodenal erosions without bleeding. Biopsied. Recommendation:           - Resume previous diet.                           - Continue present medications.                           - Await pathology results.                           - No aspirin, ibuprofen, naproxen, or other                            non-steroidal anti-inflammatory drugs. Napoleon Form, MD 09/01/2017 10:43:10 AM This report has been signed electronically.

## 2017-09-01 NOTE — Patient Instructions (Signed)
YOU HAD AN ENDOSCOPIC PROCEDURE TODAY AT THE [facility]:   Refer to the procedure report that was given to you for any specific questions about what was found during the examination.  If the procedure report does not answer your questions, please call your gastroenterologist to clarify.  If you requested that your care partner not be given the details of your procedure findings, then the procedure report has been included in a sealed envelope for you to review at your convenience later.  YOU SHOULD EXPECT: Some feelings of bloating in the abdomen. Passage of more gas than usual.  Walking can help get rid of the air that was put into your GI tract during the procedure and reduce the bloating. If you had a lower endoscopy (such as a colonoscopy or flexible sigmoidoscopy) you may notice spotting of blood in your stool or on the toilet paper. If you underwent a bowel prep for your procedure, you may not have a normal bowel movement for a few days.  Please Note:  You might notice some irritation and congestion in your nose or some drainage.  This is from the oxygen used during your procedure.  There is no need for concern and it should clear up in a day or so.  SYMPTOMS TO REPORT IMMEDIATELY:    Following upper endoscopy (EGD)  Vomiting of blood or coffee ground material  New chest pain or pain under the shoulder blades  Painful or persistently difficult swallowing  New shortness of breath  Fever of 100F or higher  Black, tarry-looking stools  For urgent or emergent issues, a gastroenterologist can be reached at any hour by calling [phone].   DIET:  We do recommend a small meal at first, but then you may proceed to your regular diet.  Drink plenty of fluids but you should avoid alcoholic beverages for 24 hours.  ACTIVITY:  You should plan to take it easy for the rest of today and you should NOT DRIVE or use heavy machinery until tomorrow (because of the sedation medicines used  during the test).    FOLLOW UP: Our staff will call the number listed on your records the next business day following your procedure to check on you and address any questions or concerns that you may have regarding the information given to you following your procedure. If we do not reach you, we will leave a message.  However, if you are feeling well and you are not experiencing any problems, there is no need to return our call.  We will assume that you have returned to your regular daily activities without incident.  If any biopsies were taken you will be contacted by phone or by letter within the next 1-3 weeks.  Please call us at [phone] if you have not heard about the biopsies in 3 weeks.    SIGNATURES/CONFIDENTIALITY: You and/or your care partner have signed paperwork which will be entered into your electronic medical record.  These signatures attest to the fact that that the information above on your After Visit Summary has been reviewed and is understood.  Full responsibility of the confidentiality of this discharge information lies with you and/or your care-partner.  NO aspirin, naproxen or other non steroidal anti inflammatory meds.

## 2017-09-01 NOTE — Progress Notes (Signed)
Report to PACU, RN, vss, BBS= Clear.  

## 2017-09-01 NOTE — Progress Notes (Signed)
Called to room to assist during endoscopic procedure.  Patient ID and intended procedure confirmed with present staff. Received instructions for my participation in the procedure from the performing physician.  

## 2017-09-01 NOTE — Progress Notes (Signed)
Pt's states no medical or surgical changes since previsit or office visit. 

## 2017-09-02 ENCOUNTER — Telehealth: Payer: Self-pay | Admitting: *Deleted

## 2017-09-02 NOTE — Telephone Encounter (Signed)
  Follow up Call-  Call back number 09/01/2017  Post procedure Call Back phone  # [phone]  Permission to leave phone message Yes    Message full; unable to leave message

## 2017-09-08 ENCOUNTER — Telehealth: Payer: Self-pay

## 2017-09-08 ENCOUNTER — Other Ambulatory Visit: Payer: Self-pay

## 2017-09-08 DIAGNOSIS — R1012 Left upper quadrant pain: Secondary | ICD-10-CM

## 2017-09-08 MED ORDER — BISMUTH SUBSALICYLATE 262 MG PO CHEW: 524.0000 mg | CHEWABLE_TABLET | Freq: Three times a day (TID) | ORAL | 0 refills | Status: AC

## 2017-09-08 MED ORDER — METRONIDAZOLE 250 MG PO TABS: 250.0000 mg | ORAL_TABLET | Freq: Three times a day (TID) | ORAL | 0 refills | Status: AC

## 2017-09-08 MED ORDER — DOXYCYCLINE HYCLATE 100 MG PO CAPS: 100.0000 mg | ORAL_CAPSULE | Freq: Two times a day (BID) | ORAL | 0 refills | Status: AC

## 2017-09-08 NOTE — Telephone Encounter (Signed)
Ok please get her samples for PPI during the period of therapy, She will need to be on PPI for 14 days along with antibiotics. Thanks

## 2017-09-08 NOTE — Telephone Encounter (Signed)
Spoke with patient. She expresses understanding of the results and the plan of care. She is presently a cash pay patient. Asks for the least expensive option. She states she uses discount cards to bring down her costs. Patient is on Nexium. Complaining of a headache that occurs after she takes Nexium. She has also had diarrhea which she attributes to Nexium. We could provide samples of Dexilant for the duration of her h pylori eradication therapy if you feel this is an option. Please advise.

## 2017-09-08 NOTE — Telephone Encounter (Signed)
-----   Message from Napoleon Form, MD sent at 09/08/2017  6:28 AM EDT -----  H.pylori positive. Please send prescription for Pylera or Bismuth  and Flagyl  1 tablet four times daily, Doxycycline  1 capsule Twice daily X 10 days along with high dose PPI (omeprazole or Nexium  BID. Will need to confirm eradication in 6 weeks by checking H.pylori stool Ag, off PPI for 2 weeks prior to the test.

## 2017-09-08 NOTE — Telephone Encounter (Signed)
Patient states she is needing to speak with [facility] again. Best call back # [phone]

## 2017-09-08 NOTE — Telephone Encounter (Signed)
With discount card, Pylera is over $900.00 Will send in the separate prescriptions. Patient will let me know if she is able to afford the treatment this way.

## 2017-09-21 ENCOUNTER — Telehealth: Payer: Self-pay | Admitting: Gastroenterology

## 2017-09-21 ENCOUNTER — Other Ambulatory Visit: Payer: Self-pay

## 2017-09-21 MED ORDER — BIS SUBCIT-METRONID-TETRACYC 140-125-125 MG PO CAPS: 3.0000 | ORAL_CAPSULE | Freq: Three times a day (TID) | ORAL | 0 refills | Status: DC

## 2017-09-21 MED ORDER — DEXLANSOPRAZOLE 60 MG PO CPDR: 60.0000 mg | DELAYED_RELEASE_CAPSULE | Freq: Two times a day (BID) | ORAL | 0 refills | Status: DC

## 2017-09-21 NOTE — Telephone Encounter (Signed)
Patient has + h pylori infection. New rx for Pylera and Rx for Dexilant sent to [facility].

## 2017-09-27 ENCOUNTER — Telehealth: Payer: Self-pay | Admitting: Gastroenterology

## 2017-09-27 ENCOUNTER — Other Ambulatory Visit: Payer: Self-pay

## 2017-09-27 MED ORDER — BIS SUBCIT-METRONID-TETRACYC 140-125-125 MG PO CAPS: 3.0000 | ORAL_CAPSULE | Freq: Three times a day (TID) | ORAL | 0 refills | Status: DC

## 2017-09-27 NOTE — Telephone Encounter (Signed)
Patient calling back wanting to know if she could just have pylera medication prescribed to pharmacy or does she need another ov. See phone note from 5.2.19.

## 2017-09-27 NOTE — Telephone Encounter (Signed)
Pt just got [employer] is requesting prescription for pylera combo. She stated that she was given something different before because she did not have insurance and could not afford pylera but the medication that she is taking is not helping. She uses [facility] on [address].

## 2017-09-27 NOTE — Telephone Encounter (Signed)
Cancel the Pylera Rx. Patient purchased the Doxycycline and Flagyl to complete the treatment. She will plan for testing for eradication of H Pylori in 4 weeks.

## 2017-10-04 ENCOUNTER — Ambulatory Visit (INDEPENDENT_AMBULATORY_CARE_PROVIDER_SITE_OTHER): Payer: Self-pay | Admitting: Nurse Practitioner

## 2017-10-05 ENCOUNTER — Telehealth: Payer: Self-pay | Admitting: Gastroenterology

## 2017-10-06 NOTE — Telephone Encounter (Signed)
Not due for retesting yet. She understands she will not retest until first week of July. Do you want her to be on a PPIuntil the 2 week cut off for retesting for H Pylori?  She is not taking any medications at all presently. Says her "stomach still hurts."

## 2017-10-07 NOTE — Telephone Encounter (Signed)
Please advise patient to continue PPI and hold it 2 weeks prior to repeat testing to confirm eradication of H. pylori.

## 2017-10-10 ENCOUNTER — Telehealth: Payer: Self-pay

## 2017-10-10 ENCOUNTER — Other Ambulatory Visit: Payer: Self-pay

## 2017-10-10 MED ORDER — DEXLANSOPRAZOLE 60 MG PO CPDR: 60.0000 mg | DELAYED_RELEASE_CAPSULE | Freq: Every day | ORAL | 0 refills | Status: DC

## 2017-10-10 NOTE — Telephone Encounter (Signed)
Left information on her voicemail. 

## 2017-10-10 NOTE — Telephone Encounter (Signed)
[facility]. The tech says she doesn't have any record of [employer] insurance. Dexilant will cost $300.00.  Cancelled the Rx for Pylera. The patient has taken the antibiotics.  Called the patient. She will come pick up samples of Dexilant.

## 2017-10-21 MED ORDER — DEXLANSOPRAZOLE 60 MG PO CPDR: 60.0000 mg | DELAYED_RELEASE_CAPSULE | Freq: Every day | ORAL | 0 refills | Status: DC

## 2017-10-21 NOTE — Addendum Note (Signed)
Addended by: Selinda MichaelsHUNT, Cornell Bourbon R on: 10/21/2017 03:04 PM   Modules accepted: Orders

## 2017-10-21 NOTE — Telephone Encounter (Signed)
Pt states she cannot pick up the samples and wants script sent to the pharmacy. Dexilant script sent in for pt.

## 2017-10-21 NOTE — Telephone Encounter (Signed)
Patient says that she is unable to pick up samples and is [facility] Best # [phone]

## 2017-10-31 ENCOUNTER — Ambulatory Visit (INDEPENDENT_AMBULATORY_CARE_PROVIDER_SITE_OTHER): Payer: Medicaid Other | Admitting: Physician Assistant

## 2017-11-14 ENCOUNTER — Telehealth: Payer: Self-pay | Admitting: Gastroenterology

## 2017-11-15 ENCOUNTER — Other Ambulatory Visit: Payer: Self-pay

## 2017-11-15 NOTE — Telephone Encounter (Signed)
Ok thank you 

## 2017-11-15 NOTE — Telephone Encounter (Signed)
It is confusing. She had a delay in taking treatment for H Pylori. She was uninsured, couldn't afford Pylera. The she picked up the alternate therapy for treating H Pylori. The It was discovered she didn't take the alternate treatment correctly because she did not get Bismuth or a PPI. The time frame is not clear either. This occurred before June.  She now has Medicaid. She does not have transportation. I spoken with her and instructed her to obtain the stool specimen in a container she must receive from our lab and following their instructions. She states understanding a agrees to this. No PPI has been sent to her pharmacy.

## 2017-11-15 NOTE — Telephone Encounter (Signed)
I am confused, did she take antibiotics along with PPI for H.pylori therapy? She needs to be off PPI for 2 weeks prior to H.pylori stool Ag test. Please verify with patient what she did with H.pylori regimen.  She can start PPI after she submits stool sample and ok to switch to preferred PPI (Omeprazole or Nexium)

## 2017-11-15 NOTE — Telephone Encounter (Signed)
Ms. Earlene PlaterDavis again did not get the PPI she was intended to take. We had offered her samples which she "couldn't get a ride to pick them up." She would like to have a prescription sent to her pharamcy and she will walk to the pharmacy. She has Medicaid. The intended PPI was Dexilant. This in a non-preferred on medicaid. May I change it to Nexium or omeprazole? She wants to take medication then do her follow up testing for H Pylori. She "doesn't have a way to get to the lab."

## 2017-12-21 ENCOUNTER — Telehealth: Payer: Self-pay | Admitting: Gastroenterology

## 2017-12-22 NOTE — Telephone Encounter (Signed)
Patient asking if her doctor was able to see her vocal cords when she had her EGD. Advised there is no mention of her vocal cords in the procedure note.

## 2017-12-30 ENCOUNTER — Ambulatory Visit (HOSPITAL_COMMUNITY)
Admission: EM | Admit: 2017-12-30 | Discharge: 2017-12-30 | Disposition: A | Discharge status: Home / Self Care | Payer: Medicaid Other | Attending: Internal Medicine | Admitting: Internal Medicine

## 2017-12-30 ENCOUNTER — Encounter (HOSPITAL_COMMUNITY): Payer: Self-pay

## 2017-12-30 DIAGNOSIS — R067 Sneezing: Secondary | ICD-10-CM

## 2017-12-30 DIAGNOSIS — R490 Dysphonia: Secondary | ICD-10-CM

## 2017-12-30 DIAGNOSIS — J069 Acute upper respiratory infection, unspecified: Secondary | ICD-10-CM

## 2017-12-30 NOTE — ED Triage Notes (Signed)
Pt presents with sneezing attacks and losing voice.

## 2017-12-30 NOTE — Discharge Instructions (Addendum)
Sneezing can be triggered by upper respiratory infections (cold).  An upper respiratory infection can also make hoarseness worse.  You may also experience runny/congested nose, cough, low-grade fever during the next several days.  Anticipate gradual improvement in well-being over the next several days.  Rest and push fluids.  Take Tylenol or Motrin as needed for discomfort.  Recheck for persistent (>3 days) fever >100.5, increasing phlegm production/nasal discharge, or if not starting to improve in a few days.

## 2017-12-30 NOTE — ED Provider Notes (Signed)
[facility]    CSN: [id number] Arrival date & time: 12/30/17  1634     History   Chief Complaint Chief Complaint  Patient presents with  . Sneezing; losing voice    HPI Makayla Russell is a 32 y.o. female.   She presents today with lot of sneezing in the last couple of days.  She has also had a little increase in chronic intermittent hoarseness.  She had upper endoscopy in the last week or so, and had an ulcer that was positive for H.  Pylori.   She does not think that the hoarseness is related to her recent endoscopy, it has been going on intermittently for several months.  No runny/congested nose, not coughing.  No fever.  No new GI upset.    HPI  Past Medical History:  Diagnosis Date  . Abortion    x 2  . Anemia   . Asthma   . Clot    in ovary vein 2018  . Depression   . Dysplasia of cervix    colposcopy  . GERD (gastroesophageal reflux disease)   . History of stomach ulcers      Past Surgical History:  Procedure Laterality Date  . NO PAST SURGERIES       Home Medications    Prior to Admission medications   Medication Sig Start Date End Date Taking? Authorizing Provider  albuterol (PROVENTIL HFA;VENTOLIN HFA) 108 (90 Base) MCG/ACT inhaler Inhale 1-2 puffs into the lungs every 6 (six) hours as needed for wheezing or shortness of breath.    [provider]    Family History Family History  Problem Relation Age of Onset  . Hypertension Mother   . Tuberculosis Maternal Grandmother   . Colon cancer Neg Hx   . Colon polyps Neg Hx     Social History Social History   Tobacco Use  . Smoking status: Never Smoker  . Smokeless tobacco: Never Used  Substance Use Topics  . Alcohol use: Yes    Frequency: Never    Comment: rare   . Drug use: No    Comment: past hx of marijuana use      Allergies   Patient has no known allergies.   Review of Systems Review of Systems  All other systems reviewed and are negative.    Physical  Exam Triage Vital Signs ED Triage Vitals  Enc Vitals Group     BP 12/30/17 1658 123/73     Pulse Rate 12/30/17 1658 91     Resp 12/30/17 1658 20     Temp 12/30/17 1658 97.6 F (36.4 C)     Temp Source 12/30/17 1658 Oral     SpO2 12/30/17 1658 100 %     Weight --      Height --      Pain Score 12/30/17 1656 0     Pain Loc --    Updated Vital Signs BP 123/73 (BP Location: Right Arm)   Pulse 91   Temp 97.6 F (36.4 C) (Oral)   Resp 20   LMP 12/09/2017 Comment: was irregular this month  SpO2 100%  Physical Exam  Constitutional: She is oriented to person, place, and time. No distress.  HENT:  Head: Atraumatic.  Bilateral TMs are translucent, no erythema Mild nasal congestion bilaterally Throat is may be slightly injected, tonsils are unremarkable  Eyes:  Conjugate gaze observed, no eye redness/discharge  Neck: Neck supple.  Cardiovascular: Normal rate and regular rhythm.  Pulmonary/Chest: No  respiratory distress. She has no wheezes. She has no rales.  Lungs clear, symmetric breath sounds   Abdominal: She exhibits no distension.  Musculoskeletal: Normal range of motion.  Neurological: She is alert and oriented to person, place, and time.  Skin: Skin is warm and dry.  Nursing note and vitals reviewed.     Final Clinical Impressions(s) / UC Diagnoses   Final diagnoses:  Acute upper respiratory infection  Sneezing  Hoarseness of voice     Discharge Instructions     Sneezing can be triggered by upper respiratory infections (cold).  An upper respiratory infection can also make hoarseness worse.  You may also experience runny/congested nose, cough, low-grade fever during the next several days.  Anticipate gradual improvement in well-being over the next several days.  Rest and push fluids.  Take Tylenol or Motrin as needed for discomfort.  Recheck for persistent (>3 days) fever >100.5, increasing phlegm production/nasal discharge, or if not starting to improve in a few  days.         Isa Rankin, MD 12/31/17 [phone]

## 2018-08-09 ENCOUNTER — Encounter: Payer: Self-pay | Admitting: *Deleted

## 2018-08-10 ENCOUNTER — Other Ambulatory Visit: Payer: Self-pay

## 2018-08-10 ENCOUNTER — Telehealth: Payer: Self-pay | Admitting: Gastroenterology

## 2018-08-10 ENCOUNTER — Ambulatory Visit (INDEPENDENT_AMBULATORY_CARE_PROVIDER_SITE_OTHER): Payer: Medicaid Other | Admitting: Gastroenterology

## 2018-08-10 ENCOUNTER — Encounter: Payer: Self-pay | Admitting: Gastroenterology

## 2018-08-10 DIAGNOSIS — R1013 Epigastric pain: Secondary | ICD-10-CM

## 2018-08-10 DIAGNOSIS — K219 Gastro-esophageal reflux disease without esophagitis: Secondary | ICD-10-CM | POA: Diagnosis not present

## 2018-08-10 DIAGNOSIS — G8929 Other chronic pain: Secondary | ICD-10-CM | POA: Diagnosis not present

## 2018-08-10 DIAGNOSIS — B9681 Helicobacter pylori [H. pylori] as the cause of diseases classified elsewhere: Secondary | ICD-10-CM

## 2018-08-10 DIAGNOSIS — K2970 Gastritis, unspecified, without bleeding: Secondary | ICD-10-CM | POA: Diagnosis not present

## 2018-08-10 MED ORDER — OMEPRAZOLE 40 MG PO CPDR: 40.0000 mg | DELAYED_RELEASE_CAPSULE | Freq: Every day | ORAL | 3 refills | Status: DC

## 2018-08-10 NOTE — Telephone Encounter (Signed)
Pt Called to inform that Omeprazole was sent to the wrong pharmacy, she now uses [facility] on [address].

## 2018-08-10 NOTE — Telephone Encounter (Signed)
[facility]ep[facility]ole sent [facility] to [facility] as requested from patient

## 2018-08-10 NOTE — Patient Instructions (Addendum)
Your provider has requested that you go to the basement level for lab work (stool study). Press "B" on the elevator. The lab is located at the first door on the left as you exit the elevator. Orders have already been placed in our system. Our lab is open from 8-4, Monday thru Friday.  Start omeprazole 40 mg daily, 30 minutes before breakfast.  It is preferable if patient is able to drop off the stool sample first and then start omeprazole.  If not, will need to hold omeprazole 2 weeks prior to the testing.  Follow Antireflux measures as recommended below.  Follow-up in office visit in 2-3 months.   Gastroesophageal Reflux Disease, Adult Gastroesophageal reflux (GER) happens when acid from the stomach flows up into the tube that connects the mouth and the stomach (esophagus). Normally, food travels down the esophagus and stays in the stomach to be digested. With GER, food and stomach acid sometimes move back up into the esophagus. You may have a disease called gastroesophageal reflux disease (GERD) if the reflux:  Happens often.  Causes frequent or very bad symptoms.  Causes problems such as damage to the esophagus. When this happens, the esophagus becomes sore and swollen (inflamed). Over time, GERD can make small holes (ulcers) in the lining of the esophagus. What are the causes? This condition is caused by a problem with the muscle between the esophagus and the stomach. When this muscle is weak or not normal, it does not close properly to keep food and acid from coming back up from the stomach. The muscle can be weak because of:  Tobacco use.  Pregnancy.  Having a certain type of hernia (hiatal hernia).  Alcohol use.  Certain foods and drinks, such as coffee, chocolate, onions, and peppermint. What increases the risk? You are more likely to develop this condition if you:  Are overweight.  Have a disease that affects your connective tissue.  Use NSAID medicines. What are the  signs or symptoms? Symptoms of this condition include:  Heartburn.  Difficult or painful swallowing.  The feeling of having a lump in the throat.  A bitter taste in the mouth.  Bad breath.  Having a lot of saliva.  Having an upset or bloated stomach.  Belching.  Chest pain. Different conditions can cause chest pain. Make sure you see your doctor if you have chest pain.  Shortness of breath or noisy breathing (wheezing).  Ongoing (chronic) cough or a cough at night.  Wearing away of the surface of teeth (tooth enamel).  Weight loss. How is this treated? Treatment will depend on how bad your symptoms are. Your doctor may suggest:  Changes to your diet.  Medicine.  Surgery. Follow these instructions at home: Eating and drinking   Follow a diet as told by your doctor. You may need to avoid foods and drinks such as: ? Coffee and tea (with or without caffeine). ? Drinks that contain alcohol. ? Energy drinks and sports drinks. ? Bubbly (carbonated) drinks or sodas. ? Chocolate and cocoa. ? Peppermint and mint flavorings. ? Garlic and onions. ? Horseradish. ? Spicy and acidic foods. These include peppers, chili powder, curry powder, vinegar, hot sauces, and BBQ sauce. ? Citrus fruit juices and citrus fruits, such as oranges, lemons, and limes. ? Tomato-based foods. These include red sauce, chili, salsa, and pizza with red sauce. ? Fried and fatty foods. These include donuts, french fries, potato chips, and high-fat dressings. ? High-fat meats. These include hot dogs, rib  eye steak, sausage, ham, and bacon. ? High-fat dairy items, such as whole milk, butter, and cream cheese.  Eat small meals often. Avoid eating large meals.  Avoid drinking large amounts of liquid with your meals.  Avoid eating meals during the 2-3 hours before bedtime.  Avoid lying down right after you eat.  Do not exercise right after you eat. Lifestyle   Do not use any products that  contain nicotine or tobacco. These include cigarettes, e-cigarettes, and chewing tobacco. If you need help quitting, ask your doctor.  Try to lower your stress. If you need help doing this, ask your doctor.  If you are overweight, lose an amount of weight that is healthy for you. Ask your doctor about a safe weight loss goal. General instructions  Pay attention to any changes in your symptoms.  Take over-the-counter and prescription medicines only as told by your doctor. Do not take aspirin, ibuprofen, or other NSAIDs unless your doctor says it is okay.  Wear loose clothes. Do not wear anything tight around your waist.  Raise (elevate) the head of your bed about 6 inches (15 cm).  Avoid bending over if this makes your symptoms worse.  Keep all follow-up visits as told by your doctor. This is important. Contact a doctor if:  You have new symptoms.  You lose weight and you do not know why.  You have trouble swallowing or it hurts to swallow.  You have wheezing or a cough that keeps happening.  Your symptoms do not get better with treatment.  You have a hoarse voice. Get help right away if:  You have pain in your arms, neck, jaw, teeth, or back.  You feel sweaty, dizzy, or light-headed.  You have chest pain or shortness of breath.  You throw up (vomit) and your throw-up looks like blood or coffee grounds.  You pass out (faint).  Your poop (stool) is bloody or black.  You cannot swallow, drink, or eat. Summary  If a person has gastroesophageal reflux disease (GERD), food and stomach acid move back up into the esophagus and cause symptoms or problems such as damage to the esophagus.  Treatment will depend on how bad your symptoms are.  Follow a diet as told by your doctor.  Take all medicines only as told by your doctor. This information is not intended to replace advice given to you by your health care provider. Make sure you discuss any questions you have with your  health care provider. Document Released: 10/13/2007 Document Revised: 11/02/2017 Document Reviewed: 11/02/2017 Elsevier Interactive Patient Education  2019 [employer].

## 2018-08-10 NOTE — Progress Notes (Signed)
Makayla Russell    [id number]    29-Jul-1985  Primary Care Physician:Patient, No Pcp Per  Referring Physician: No referring provider defined for this encounter.  This service was provided via telemedicine due to COVID 19.  Patient location: Home Provider location: Office Used 2 patient identifiers to confirm the correct person. Explained the limitations in evaluation and management via telemedicine. Patient is aware of potential medical charges for this visit.  Patient consented to this virtual visit (via telephone/webex).  The persons participating in this telemedicine service were myself and the patient  Interactive audio and video telecommunications were attempted between this provider and patient, however failed, due to patient having technical difficulties OR patient did not have access to video capability. We continued and completed visit with audio only. Patient was unable to to download the Zoom meeting.   Chief complaint:  Epigastric abdominal pain  HPI:  33 yr F with history of H. pylori gastritis status post therapy with complaints of persistent intermittent epigastric abdominal pain. She was treated for H. pylori last year after gastric biopsies were positive.  Patient reports taking all the medications.  Noted having epigastric abdominal pain in the past few months, feels similar to what she had last year. Took over-the-counter antacid with no improvement She is also having fluid in the back of the throat and nasal congestion for the past 3 months.  She developed it after she was in the hospital for [facility] complicated requiring blood transfusions. Denies any fever or cough  She has intermittent heartburn, regurgitation but denies any dysphagia, odynophagia, nausea, vomiting, change in bowel habits, melena or blood per rectum. No family history of GI malignancy.  Currently not taking any acid suppression medication  EGD September 01, 2017 showed duodenal  erosions, biopsies showed slight Brunner gland hyperplasia otherwise normal. Esophagus and stomach appeared normal.  H. pylori positive.   Outpatient Encounter Medications as of 08/10/2018  Medication Sig  . albuterol (PROVENTIL HFA;VENTOLIN HFA) 108 (90 Base) MCG/ACT inhaler Inhale 1-2 puffs into the lungs every 6 (six) hours as needed for wheezing or shortness of breath.   Facility-Administered Encounter Medications as of 08/10/2018  Medication  . 0.9 %  sodium chloride infusion    Allergies as of 08/10/2018  . (No Known Allergies)    Past Medical History:  Diagnosis Date  . Abortion    x 2  . Anemia   . Asthma   . Clot    in ovary vein 2018  . Depression   . Dysplasia of cervix    colposcopy  . GERD (gastroesophageal reflux disease)   . History of stomach ulcers     Past Surgical History:  Procedure Laterality Date  . DILATION AND CURETTAGE OF UTERUS      Family History  Problem Relation Age of Onset  . Hypertension Mother   . Tuberculosis Maternal Grandmother   . Colon cancer Neg Hx   . Colon polyps Neg Hx     Social History   Socioeconomic History  . Marital status: Single    Spouse name: Not on file  . Number of children: 5  . Years of education: Not on file  . Highest education level: Not on file  Occupational History  . Not on file  Social Needs  . Financial resource strain: Not on file  . Food insecurity:    Worry: Not on file    Inability: Not on file  . Transportation  needs:    Medical: Not on file    Non-medical: Not on file  Tobacco Use  . Smoking status: Never Smoker  . Smokeless tobacco: Never Used  Substance and Sexual Activity  . Alcohol use: Yes    Frequency: Never    Comment: rare   . Drug use: No    Comment: past hx of marijuana use   . Sexual activity: Not on file  Lifestyle  . Physical activity:    Days per week: Not on file    Minutes per session: Not on file  . Stress: Not on file  Relationships  . Social connections:     Talks on phone: Not on file    Gets together: Not on file    Attends religious service: Not on file    Active member of club or organization: Not on file    Attends meetings of clubs or organizations: Not on file    Relationship status: Not on file  . Intimate partner violence:    Fear of current or ex partner: Not on file    Emotionally abused: Not on file    Physically abused: Not on file    Forced sexual activity: Not on file  Other Topics Concern  . Not on file  Social History Narrative  . Not on file      Review of systems: Review of Systems as per HPI All other systems reviewed and are negative.   Physical Exam: Vitals were not taken and physical exam was not performed during this virtual visit.  Data Reviewed:  Reviewed labs, radiology imaging, old records and pertinent past GI work up   Assessment and Plan/Recommendations:  33 year old female with H. Pylori gastritis status post therapy with complaints of remittent epigastric abdominal pain and GERD symptoms  Check H. pylori stool antigen to confirm eradication.   Start omeprazole 40 mg daily, 30 minutes before breakfast.  Preferable if patient is able to drop off the stool sample first and then start omeprazole.  If not, will need to hold omeprazole 2 weeks prior to the testing.  Antireflux measures  Follow-up in office visit in 2-3 months      K. Scherry Ran , MD   CC: No ref. provider found

## 2018-08-10 NOTE — Addendum Note (Signed)
Addended by: Richardson Chiquito on: 08/10/2018 12:48 PM   Modules accepted: Orders

## 2018-08-28 ENCOUNTER — Telehealth: Payer: Self-pay | Admitting: Gastroenterology

## 2018-08-28 MED ORDER — OMEPRAZOLE 40 MG PO CPDR: 40.0000 mg | DELAYED_RELEASE_CAPSULE | Freq: Every day | ORAL | 3 refills | Status: DC

## 2018-08-28 NOTE — Telephone Encounter (Signed)
Omeprazole sent [facility] to [facility] in [place]  This was sent in by [facility] on 08/10/2018

## 2018-08-28 NOTE — Telephone Encounter (Signed)
[facility] calling to inform [facility] not [facility] for acid reflu, pt did not remember the name of med. [facility] [address] in [place].

## 2018-09-22 ENCOUNTER — Telehealth: Payer: Self-pay | Admitting: *Deleted

## 2018-09-22 NOTE — Telephone Encounter (Signed)
Left voicemail for patient to call back for office follow up with Dr Lavon Paganini (h pylori gastritis).

## 2018-09-25 NOTE — Telephone Encounter (Signed)
Left message for patient to call back  

## 2018-09-26 NOTE — Telephone Encounter (Signed)
Left messsage for patient to call back and sent letter in mail since we have not been able to speak with her by phone to date.

## 2019-05-11 DIAGNOSIS — Z5189 Encounter for other specified aftercare: Secondary | ICD-10-CM

## 2019-05-11 HISTORY — DX: Encounter for other specified aftercare: Z51.89

## 2019-11-15 ENCOUNTER — Telehealth: Payer: Self-pay | Admitting: Gastroenterology

## 2019-11-15 NOTE — Telephone Encounter (Signed)
Dr. Lavon Paganini  This pt has seen you in the past.  She moved and had seen a GI doctor in [place]. She has since returned to [place] to live and would like to see you again.  I will send records from  Dr. Maryella Shivers. Will you accept this pt back?

## 2019-11-19 NOTE — Telephone Encounter (Signed)
Ok to schedule next available appointment,  please obtain records from previous GI in [place].  Thank you

## 2019-11-22 ENCOUNTER — Telehealth: Payer: Self-pay | Admitting: Gastroenterology

## 2019-11-22 NOTE — Telephone Encounter (Signed)
Pt called and stated she would get records and check her insurance and call us back to schedule appt

## 2019-12-25 ENCOUNTER — Encounter (HOSPITAL_COMMUNITY): Payer: Self-pay

## 2019-12-25 ENCOUNTER — Emergency Department (HOSPITAL_COMMUNITY)
Admission: EM | Admit: 2019-12-25 | Discharge: 2019-12-26 | Disposition: A | Discharge status: Home / Self Care | Payer: Medicaid Other | Attending: Emergency Medicine | Admitting: Emergency Medicine

## 2019-12-25 ENCOUNTER — Other Ambulatory Visit: Payer: Self-pay

## 2019-12-25 DIAGNOSIS — G44209 Tension-type headache, unspecified, not intractable: Secondary | ICD-10-CM | POA: Diagnosis present

## 2019-12-25 DIAGNOSIS — IMO0001 Reserved for inherently not codable concepts without codable children: Secondary | ICD-10-CM

## 2019-12-25 DIAGNOSIS — R631 Polydipsia: Secondary | ICD-10-CM | POA: Insufficient documentation

## 2019-12-25 DIAGNOSIS — R638 Other symptoms and signs concerning food and fluid intake: Secondary | ICD-10-CM

## 2019-12-25 DIAGNOSIS — J45909 Unspecified asthma, uncomplicated: Secondary | ICD-10-CM | POA: Insufficient documentation

## 2019-12-25 LAB — CBC
HCT: 34.4 % — ABNORMAL LOW (ref 36.0–46.0)
Hemoglobin: 10.5 g/dL — ABNORMAL LOW (ref 12.0–15.0)
MCH: 28.8 pg (ref 26.0–34.0)
MCHC: 30.5 g/dL (ref 30.0–36.0)
MCV: 94.2 fL (ref 80.0–100.0)
Platelets: 320 10*3/uL (ref 150–400)
RBC: 3.65 MIL/uL — ABNORMAL LOW (ref 3.87–5.11)
RDW: 15.8 % — ABNORMAL HIGH (ref 11.5–15.5)
WBC: 3.6 10*3/uL — ABNORMAL LOW (ref 4.0–10.5)
nRBC: 0 % (ref 0.0–0.2)

## 2019-12-25 LAB — BASIC METABOLIC PANEL
Anion gap: 8 (ref 5–15)
BUN: 7 mg/dL (ref 6–20)
CO2: 23 mmol/L (ref 22–32)
Calcium: 9.1 mg/dL (ref 8.9–10.3)
Chloride: 107 mmol/L (ref 98–111)
Creatinine, Ser: 0.78 mg/dL (ref 0.44–1.00)
GFR calc Af Amer: >60 mL/min (ref >60)
GFR calc non Af Amer: >60 mL/min (ref >60)
Glucose, Bld: 73 mg/dL (ref 70–99)
Potassium: 3.4 mmol/L — ABNORMAL LOW (ref 3.5–5.1)
Sodium: 138 mmol/L (ref 135–145)

## 2019-12-25 LAB — I-STAT BETA HCG BLOOD, ED (MC, WL, AP ONLY): I-stat hCG, quantitative: <5 m[IU]/mL (ref <5)

## 2019-12-25 NOTE — ED Triage Notes (Signed)
Patient complains of thirst, metallic taste and feeling tired for 1 week. Patient alert and oriented, had Covid in April. Reports vague headache

## 2019-12-26 NOTE — ED Notes (Signed)
Pt states she has been feeling dehydrated and weak. Pt states she has had a headache on and off for 2 weeks now. Pt states she drinks a lot of water but doesn't seem to help. Pt also states she has dry skin and dark spots on her face she is concerned about.

## 2019-12-26 NOTE — ED Provider Notes (Signed)
[facility] EMERGENCY DEPARTMENT Provider Note  CSN: [id number] Arrival date & time: 12/25/19 1158  Chief Complaint(s) No chief complaint on file.  HPI Makayla Russell is a 34 y.o. female   CC: headache  Onset/Duration: 1 week Timing: intermittent, currently resolved Location: occipital Quality: aching, tightness with shooting pain  Severity: mild to moderate Modifying Factors:  Improved by: nothing  Worsened by: nothing Associated Signs/Symptoms:  Pertinent (+): none  Pertinent (-): fever, chills, N/V, visual changes Context:   Additionally reporting increased thirst and fatigue.    HPI  Past Medical History Past Medical History:  Diagnosis Date  . Abortion    x 2  . Anemia   . Asthma   . Clot    in ovary vein 2018  . Depression   . Dysplasia of cervix    colposcopy  . GERD (gastroesophageal reflux disease)   . History of stomach ulcers    There are no problems to display for this patient.  Home Medication(s) Prior to Admission medications   Medication Sig Start Date End Date Taking? Authorizing Provider  albuterol (PROVENTIL HFA;VENTOLIN HFA) 108 (90 Base) MCG/ACT inhaler Inhale 1-2 puffs into the lungs every 6 (six) hours as needed for wheezing or shortness of breath.    [provider]  omeprazole (PRILOSEC) 40 MG capsule Take 1 capsule (40 mg total) by mouth daily. 30 MINUTES BEFORE BREAKFAST 08/28/18   Napoleon Form, MD                                                                                                                                    Past Surgical History Past Surgical History:  Procedure Laterality Date  . DILATION AND CURETTAGE OF UTERUS     Family History Family History  Problem Relation Age of Onset  . Hypertension Mother   . Tuberculosis Maternal Grandmother   . Colon cancer Neg Hx   . Colon polyps Neg Hx     Social History Social History   Tobacco Use  . Smoking status: Never Smoker  .  Smokeless tobacco: Never Used  Vaping Use  . Vaping Use: Never used  Substance Use Topics  . Alcohol use: Yes    Comment: rare   . Drug use: No    Comment: past hx of marijuana use    Allergies Patient has no known allergies.  Review of Systems Review of Systems All other systems are reviewed and are negative for acute change except as noted in the HPI  Physical Exam Vital Signs  I have reviewed the triage vital signs BP 117/81 (BP Location: Right Arm)   Pulse 60   Temp 98.4 F (36.9 C) (Oral)   Resp 16   Ht 5\' 7"  (1.702 m)   Wt 63.5 kg   SpO2 100%   BMI 21.93 kg/m   Physical Exam Vitals reviewed.  Constitutional:      General:  She is not in acute distress.    Appearance: She is well-developed. She is not diaphoretic.  HENT:     Head: Normocephalic and atraumatic.     Nose: Nose normal.  Eyes:     General: No scleral icterus.       Right eye: No discharge.        Left eye: No discharge.     Conjunctiva/sclera: Conjunctivae normal.     Pupils: Pupils are equal, round, and reactive to light.  Cardiovascular:     Rate and Rhythm: Normal rate and regular rhythm.     Heart sounds: No murmur heard.  No friction rub. No gallop.   Pulmonary:     Effort: Pulmonary effort is normal. No respiratory distress.     Breath sounds: Normal breath sounds. No stridor. No rales.  Abdominal:     General: There is no distension.     Palpations: Abdomen is soft.     Tenderness: There is no abdominal tenderness.  Musculoskeletal:        General: No tenderness.     Cervical back: Normal range of motion and neck supple.  Skin:    General: Skin is warm and dry.     Findings: No erythema or rash.  Neurological:     Mental Status: She is alert and oriented to person, place, and time.     Cranial Nerves: Cranial nerves are intact.     Sensory: Sensation is intact.     Motor: Motor function is intact.     ED Results and Treatments Labs (all labs ordered are listed, but only  abnormal results are displayed) Labs Reviewed  BASIC METABOLIC PANEL - Abnormal; Notable for the following components:      Result Value   Potassium 3.4 (*)    All other components within normal limits  CBC - Abnormal; Notable for the following components:   WBC 3.6 (*)    RBC 3.65 (*)    Hemoglobin 10.5 (*)    HCT 34.4 (*)    RDW 15.8 (*)    All other components within normal limits  I-STAT BETA HCG BLOOD, ED ([facility], [facility], [facility] ONLY)                                                                                                                         EKG  EKG Interpretation  Date/Time:    Ventricular Rate:    PR Interval:    QRS Duration:   QT Interval:    QTC Calculation:   R Axis:     Text Interpretation:        Radiology No results found.  Pertinent labs & imaging results that were available during my care of the patient were reviewed by me and considered in my medical decision making (see chart for details).  Medications Ordered in ED Medications - No data to display  Procedures Procedures  (including critical care time)  Medical Decision Making / ED Course I have reviewed the nursing notes for this encounter and the patient's prior records (if available in EHR or on provided paperwork).   Makayla Russell was evaluated in Emergency Department on 12/26/2019 for the symptoms described in the history of present illness. She was evaluated in the context of the global COVID-19 pandemic, which necessitated consideration that the patient might be at risk for infection with the SARS-CoV-2 virus that causes COVID-19. Institutional protocols and algorithms that pertain to the evaluation of patients at risk for COVID-19 are in a state of rapid change based on information released by regulatory bodies including the CDC and federal and state organizations. These  policies and algorithms were followed during the patient's care in the ED.  Non focal neuro exam. No recent head trauma. No fever. Doubt meningitis. Doubt intracranial bleed. Doubt IIH. No indication for imaging.   Pain free currently. Likely tension headache.  Due to increased thirst and fatigue. Screening labs ordered and no evidence of DM noted. Patient did have mild hypoK+. Stable Hb. All reassuring.      Final Clinical Impression(s) / ED Diagnoses Final diagnoses:  Thirst  Tension headache   The patient appears reasonably screened and/or stabilized for discharge and I doubt any other medical condition or other [facility] requiring further screening, evaluation, or treatment in the ED at this time prior to discharge. Safe for discharge with strict return precautions.  Disposition: Discharge  Condition: Good  I have discussed the results, Dx and Tx plan with the patient/family who expressed understanding and agree(s) with the plan. Discharge instructions discussed at length. The patient/family was given strict return precautions who verbalized understanding of the instructions. No further questions at time of discharge.    ED Discharge Orders    None       Follow Up: Primary care provider  Schedule an appointment as soon as possible for a visit  If you do not have a primary care physician, contact [facility] at [phone] for referral      This chart was dictated using voice recognition software.  Despite best efforts to proofread,  errors can occur which can change the documentation meaning.   Nira Conn, MD 12/26/19 [phone]

## 2019-12-27 ENCOUNTER — Inpatient Hospital Stay
Admission: RE | Admit: 2019-12-27 | Discharge: 2019-12-27 | Disposition: A | Discharge status: Home / Self Care | Payer: Medicaid Other | Source: Ambulatory Visit

## 2019-12-27 ENCOUNTER — Other Ambulatory Visit: Payer: Self-pay

## 2019-12-27 ENCOUNTER — Inpatient Hospital Stay: Admission: RE | Admit: 2019-12-27 | Payer: Medicaid Other | Source: Ambulatory Visit

## 2020-02-06 ENCOUNTER — Ambulatory Visit: Payer: Medicaid Other | Admitting: Gastroenterology

## 2020-02-06 NOTE — Progress Notes (Incomplete)
Makayla Russell    [id number]    [birth year]  Primary Care Physician:Patient, No Pcp Per  Referring Physician: No referring provider defined for this encounter.   Chief complaint:  ***  HPI:  H.pylori gastritis s/p treatment   EGD September 01, 2017 showed duodenal erosions, biopsies showed slight Brunner gland hyperplasia otherwise normal. Esophagus and stomach appeared normal.  H. pylori positive.     Outpatient Encounter Medications as of 02/06/2020  Medication Sig  . albuterol (PROVENTIL HFA;VENTOLIN HFA) 108 (90 Base) MCG/ACT inhaler Inhale 1-2 puffs into the lungs every 6 (six) hours as needed for wheezing or shortness of breath.  Marland Kitchen omeprazole (PRILOSEC) 40 MG capsule Take 1 capsule (40 mg total) by mouth daily. 30 MINUTES BEFORE BREAKFAST   Facility-Administered Encounter Medications as of 02/06/2020  Medication  . 0.9 %  sodium chloride infusion    Allergies as of 02/06/2020  . (No Known Allergies)    Past Medical History:  Diagnosis Date  . Abortion    x 2  . Anemia   . Asthma   . Clot    in ovary vein 2018  . Depression   . Dysplasia of cervix    colposcopy  . GERD (gastroesophageal reflux disease)   . History of stomach ulcers     Past Surgical History:  Procedure Laterality Date  . DILATION AND CURETTAGE OF UTERUS      Family History  Problem Relation Age of Onset  . Hypertension Mother   . Tuberculosis Maternal Grandmother   . Colon cancer Neg Hx   . Colon polyps Neg Hx     Social History   Socioeconomic History  . Marital status: Single    Spouse name: Not on file  . Number of children: 5  . Years of education: Not on file  . Highest education level: Not on file  Occupational History  . Not on file  Tobacco Use  . Smoking status: Never Smoker  . Smokeless tobacco: Never Used  Vaping Use  . Vaping Use: Never used  Substance and Sexual Activity  . Alcohol use: Yes    Comment: rare   . Drug use: No    Comment: past hx  of marijuana use   . Sexual activity: Not on file  Other Topics Concern  . Not on file  Social History Narrative  . Not on file   Social Determinants of Health   Financial Resource Strain:   . Difficulty of Paying Living Expenses: Not on file  Food Insecurity:   . Worried About Programme researcher, broadcasting/film/video in the Last Year: Not on file  . Ran Out of Food in the Last Year: Not on file  Transportation Needs:   . Lack of Transportation (Medical): Not on file  . Lack of Transportation (Non-Medical): Not on file  Physical Activity:   . Days of Exercise per Week: Not on file  . Minutes of Exercise per Session: Not on file  Stress:   . Feeling of Stress : Not on file  Social Connections:   . Frequency of Communication with Friends and Family: Not on file  . Frequency of Social Gatherings with Friends and Family: Not on file  . Attends Religious Services: Not on file  . Active Member of Clubs or Organizations: Not on file  . Attends Banker Meetings: Not on file  . Marital Status: Not on file  Intimate Partner Violence:   .  Fear of Current or Ex-Partner: Not on file  . Emotionally Abused: Not on file  . Physically Abused: Not on file  . Sexually Abused: Not on file      Review of systems: All other review of systems negative except as mentioned in the HPI.   Physical Exam: There were no vitals filed for this visit. There is no height or weight on file to calculate BMI. Gen:      No acute distress HEENT:  sclera anicteric Abd:      soft, non-tender; no palpable masses, no distension Ext:    No edema Neuro: alert and oriented x 3 Psych: normal mood and affect  Data Reviewed:  Reviewed labs, radiology imaging, old records and pertinent past GI work up   Assessment and Plan/Recommendations:  ***  This visit required *** minutes of patient care (this includes precharting, chart review, review of results, face-to-face time used for counseling as well as treatment  plan and follow-up. The patient was provided an opportunity to ask questions and all were answered. The patient agreed with the plan and demonstrated an understanding of the instructions.  Iona Beard , MD    CC: No ref. provider found

## 2020-04-09 ENCOUNTER — Ambulatory Visit (INDEPENDENT_AMBULATORY_CARE_PROVIDER_SITE_OTHER): Payer: Medicaid Other | Admitting: Gastroenterology

## 2020-04-09 ENCOUNTER — Encounter: Payer: Self-pay | Admitting: Gastroenterology

## 2020-04-09 VITALS — BP 110/64 | HR 84 | Ht 66.0 in | Wt 135.0 lb

## 2020-04-09 DIAGNOSIS — K581 Irritable bowel syndrome with constipation: Secondary | ICD-10-CM

## 2020-04-09 DIAGNOSIS — R1012 Left upper quadrant pain: Secondary | ICD-10-CM

## 2020-04-09 DIAGNOSIS — R1013 Epigastric pain: Secondary | ICD-10-CM | POA: Diagnosis not present

## 2020-04-09 DIAGNOSIS — Z8619 Personal history of other infectious and parasitic diseases: Secondary | ICD-10-CM

## 2020-04-09 DIAGNOSIS — K5900 Constipation, unspecified: Secondary | ICD-10-CM

## 2020-04-09 DIAGNOSIS — K219 Gastro-esophageal reflux disease without esophagitis: Secondary | ICD-10-CM | POA: Diagnosis not present

## 2020-04-09 MED ORDER — LINACLOTIDE 290 MCG PO CAPS: 290.0000 ug | ORAL_CAPSULE | Freq: Every day | ORAL | 2 refills | Status: DC

## 2020-04-09 NOTE — Patient Instructions (Signed)
You have been scheduled for an endoscopy. Please follow written instructions given to you at your visit today. If you use inhalers (even only as needed), please bring them with you on the day of your procedure.   We have sent Linzess to your pharmacy  Follow up in 3 months  Due to recent changes in healthcare laws, you may see the results of your imaging and laboratory studies on MyChart before your provider has had a chance to review them.  We understand that in some cases there may be results that are confusing or concerning to you. Not all laboratory results come back in the same time frame and the provider may be waiting for multiple results in order to interpret others.  Please give [place] 48 hours in order for your provider to thoroughly review all the results before contacting the office for clarification of your results.   I appreciate the  opportunity to care for you  Thank You   Marsa Aris , MD

## 2020-04-09 NOTE — Progress Notes (Signed)
Makayla Russell    [id number]    1986/03/03  Primary Care Physician:Patient, No Pcp Per  Referring Physician: No referring provider defined for this encounter.   Chief complaint: Epigastric and left upper quadrant abdominal pain  HPI:  34 year old very pleasant African-American female here for follow-up visit with complaints of upper abdominal and epigastric pain  She was treated for H. pylori gastritis in 2019, did not do stool test to confirm eradication In the past year she started noticing recurrent symptoms and a progressively worse She is also having pain in her throat and burning sensation associated with hoarseness.  Denies any dysphagia. She was treated with multiple courses of antibiotics for bacterial vaginosis  She is currently not on any antacids or PPI, does not feel they are improving her symptoms  Bowel habits have changed, she is experiencing constipation with bowel movements 2-3 times a week.  Denies any melena or blood per rectum No family history of GI malignancy or colon cancer  EGD September 01, 2017 showed duodenal erosions, biopsies showed slight Brunner gland hyperplasia otherwise normal. Esophagus and stomach appeared normal.  H. pylori positive.  Outpatient Encounter Medications as of 04/09/2020  Medication Sig  . [DISCONTINUED] albuterol (PROVENTIL HFA;VENTOLIN HFA) 108 (90 Base) MCG/ACT inhaler Inhale 1-2 puffs into the lungs every 6 (six) hours as needed for wheezing or shortness of breath. (Patient not taking: Reported on 04/09/2020)  . [DISCONTINUED] omeprazole (PRILOSEC) 40 MG capsule Take 1 capsule (40 mg total) by mouth daily. 30 MINUTES BEFORE BREAKFAST (Patient not taking: Reported on 04/09/2020)   Facility-Administered Encounter Medications as of 04/09/2020  Medication  . 0.9 %  sodium chloride infusion    Allergies as of 04/09/2020  . (No Known Allergies)    Past Medical History:  Diagnosis Date  . Abortion    x 2  . Anemia     . Asthma   . Clot    in ovary vein 2018  . Depression   . Dysplasia of cervix    colposcopy  . GERD (gastroesophageal reflux disease)   . History of stomach ulcers     Past Surgical History:  Procedure Laterality Date  . DILATION AND CURETTAGE OF UTERUS      Family History  Problem Relation Age of Onset  . Hypertension Mother   . Tuberculosis Maternal Grandmother   . Colon cancer Neg Hx   . Colon polyps Neg Hx     Social History   Socioeconomic History  . Marital status: Single    Spouse name: Not on file  . Number of children: 5  . Years of education: Not on file  . Highest education level: Not on file  Occupational History  . Not on file  Tobacco Use  . Smoking status: Never Smoker  . Smokeless tobacco: Never Used  Vaping Use  . Vaping Use: Never used  Substance and Sexual Activity  . Alcohol use: Yes    Comment: rare   . Drug use: No    Comment: past hx of marijuana use   . Sexual activity: Yes  Other Topics Concern  . Not on file  Social History Narrative  . Not on file   Social Determinants of Health   Financial Resource Strain:   . Difficulty of Paying Living Expenses: Not on file  Food Insecurity:   . Worried About Programme researcher, broadcasting/film/video in the Last Year: Not on file  . Ran Out  of Food in the Last Year: Not on file  Transportation Needs:   . Lack of Transportation (Medical): Not on file  . Lack of Transportation (Non-Medical): Not on file  Physical Activity:   . Days of Exercise per Week: Not on file  . Minutes of Exercise per Session: Not on file  Stress:   . Feeling of Stress : Not on file  Social Connections:   . Frequency of Communication with Friends and Family: Not on file  . Frequency of Social Gatherings with Friends and Family: Not on file  . Attends Religious Services: Not on file  . Active Member of Clubs or Organizations: Not on file  . Attends Banker Meetings: Not on file  . Marital Status: Not on file  Intimate  Partner Violence:   . Fear of Current or Ex-Partner: Not on file  . Emotionally Abused: Not on file  . Physically Abused: Not on file  . Sexually Abused: Not on file      Review of systems: All other review of systems negative except as mentioned in the HPI.   Physical Exam: Vitals:   04/09/20 1033  BP: 110/64  Pulse: 84  SpO2: 100%   Body mass index is 21.79 kg/m. Gen:      No acute distress HEENT:  sclera anicteric Abd:      soft, non-tender; no palpable masses, no distension Ext:    No edema Neuro: alert and oriented x 3 Psych: normal mood and affect  Data Reviewed:  Reviewed labs, radiology imaging, old records and pertinent past GI work up   Assessment and Plan/Recommendations:  34 year old very pleasant female with history of H. pylori gastritis s/p treatment with complaints of left upper quadrant and epigastric abdominal pain  Schedule for EGD to exclude persistent H. pylori gastritis or peptic ulcer disease The risks and benefits as well as alternatives of endoscopic procedure(s) have been discussed and reviewed. All questions answered. The patient agrees to proceed.  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 The patient was provided an opportunity to ask questions and all were answered. The patient agreed with the plan and demonstrated an understanding of the instructions.  Iona Beard , MD    CC: No ref. provider found

## 2020-04-10 ENCOUNTER — Encounter: Payer: Self-pay | Admitting: Gastroenterology

## 2020-04-11 ENCOUNTER — Encounter: Payer: Medicaid Other | Admitting: Gastroenterology

## 2020-04-14 ENCOUNTER — Telehealth: Payer: Self-pay | Admitting: *Deleted

## 2020-04-14 NOTE — Telephone Encounter (Signed)
ok 

## 2020-04-14 NOTE — Telephone Encounter (Signed)
Pt did not show for covid test.  Called her to make her aware that procedure would need to be rescheduled.  Rescheduled her for 05/15/20 at 830am.  Covid test scheduled for 05/13/20 @ 1:00 pm

## 2020-04-15 ENCOUNTER — Encounter: Payer: Medicaid Other | Admitting: Gastroenterology

## 2020-05-13 ENCOUNTER — Other Ambulatory Visit: Payer: Self-pay

## 2020-05-13 ENCOUNTER — Ambulatory Visit (INDEPENDENT_AMBULATORY_CARE_PROVIDER_SITE_OTHER): Payer: Medicaid Other | Admitting: Advanced Practice Midwife

## 2020-05-13 ENCOUNTER — Encounter: Payer: Self-pay | Admitting: Advanced Practice Midwife

## 2020-05-13 VITALS — BP 105/79 | HR 93 | Ht 66.0 in | Wt 134.2 lb

## 2020-05-13 DIAGNOSIS — Z113 Encounter for screening for infections with a predominantly sexual mode of transmission: Secondary | ICD-10-CM

## 2020-05-13 DIAGNOSIS — Z01419 Encounter for gynecological examination (general) (routine) without abnormal findings: Secondary | ICD-10-CM

## 2020-05-13 DIAGNOSIS — Z3201 Encounter for pregnancy test, result positive: Secondary | ICD-10-CM

## 2020-05-13 LAB — POCT PREGNANCY, URINE: Preg Test, Ur: POSITIVE — AB

## 2020-05-13 NOTE — Patient Instructions (Signed)
Preventive Care 21-36 Years Old, Female Preventive care refers to visits with your health care provider and lifestyle choices that can promote health and wellness. This includes:  A yearly physical exam. This may also be called an annual well check.  Regular dental visits and eye exams.  Immunizations.  Screening for certain conditions.  Healthy lifestyle choices, such as eating a healthy diet, getting regular exercise, not using drugs or products that contain nicotine and tobacco, and limiting alcohol use. What can I expect for my preventive care visit? Physical exam Your health care provider will check your:  Height and weight. This may be used to calculate body mass index (BMI), which tells if you are at a healthy weight.  Heart rate and blood pressure.  Skin for abnormal spots. Counseling Your health care provider may ask you questions about your:  Alcohol, tobacco, and drug use.  Emotional well-being.  Home and relationship well-being.  Sexual activity.  Eating habits.  Work and work environment.  Method of birth control.  Menstrual cycle.  Pregnancy history. What immunizations do I need?  Influenza (flu) vaccine  This is recommended every year. Tetanus, diphtheria, and pertussis (Tdap) vaccine  You may need a Td booster every 10 years. Varicella (chickenpox) vaccine  You may need this if you have not been vaccinated. Human papillomavirus (HPV) vaccine  If recommended by your health care provider, you may need three doses over 6 months. Measles, mumps, and rubella (MMR) vaccine  You may need at least one dose of MMR. You may also need a second dose. Meningococcal conjugate (MenACWY) vaccine  One dose is recommended if you are age 19-21 years and a first-year college student living in a residence hall, or if you have one of several medical conditions. You may also need additional booster doses. Pneumococcal conjugate (PCV13) vaccine  You may need  this if you have certain conditions and were not previously vaccinated. Pneumococcal polysaccharide (PPSV23) vaccine  You may need one or two doses if you smoke cigarettes or if you have certain conditions. Hepatitis A vaccine  You may need this if you have certain conditions or if you travel or work in places where you may be exposed to hepatitis A. Hepatitis B vaccine  You may need this if you have certain conditions or if you travel or work in places where you may be exposed to hepatitis B. Haemophilus influenzae type b (Hib) vaccine  You may need this if you have certain conditions. You may receive vaccines as individual doses or as more than one vaccine together in one shot (combination vaccines). Talk with your health care provider about the risks and benefits of combination vaccines. What tests do I need?  Blood tests  Lipid and cholesterol levels. These may be checked every 5 years starting at age 20.  Hepatitis C test.  Hepatitis B test. Screening  Diabetes screening. This is done by checking your blood sugar (glucose) after you have not eaten for a while (fasting).  Sexually transmitted disease (STD) testing.  BRCA-related cancer screening. This may be done if you have a family history of breast, ovarian, tubal, or peritoneal cancers.  Pelvic exam and Pap test. This may be done every 3 years starting at age 21. Starting at age 30, this may be done every 5 years if you have a Pap test in combination with an HPV test. Talk with your health care provider about your test results, treatment options, and if necessary, the need for more tests.   Follow these instructions at home: Eating and drinking   Eat a diet that includes fresh fruits and vegetables, whole grains, lean protein, and low-fat dairy.  Take vitamin and mineral supplements as recommended by your health care provider.  Do not drink alcohol if: ? Your health care provider tells you not to drink. ? You are  pregnant, may be pregnant, or are planning to become pregnant.  If you drink alcohol: ? Limit how much you have to 0-1 drink a day. ? Be aware of how much alcohol is in your drink. In the [place] one drink equals one 12 oz bottle of beer (355 mL), one 5 oz glass of wine (148 mL), or one 1 oz glass of hard liquor (44 mL). Lifestyle  Take daily care of your teeth and gums.  Stay active. Exercise for at least 30 minutes on 5 or more days each week.  Do not use any products that contain nicotine or tobacco, such as cigarettes, e-cigarettes, and chewing tobacco. If you need help quitting, ask your health care provider.  If you are sexually active, practice safe sex. Use a condom or other form of birth control (contraception) in order to prevent pregnancy and STIs (sexually transmitted infections). If you plan to become pregnant, see your health care provider for a preconception visit. What's next?  Visit your health care provider once a year for a well check visit.  Ask your health care provider how often you should have your eyes and teeth checked.  Stay up to date on all vaccines. This information is not intended to replace advice given to you by your health care provider. Make sure you discuss any questions you have with your health care provider. Document Revised: 01/05/2018 Document Reviewed: 01/05/2018 Elsevier Patient Education  2020 [employer].

## 2020-05-14 LAB — HEPATITIS C ANTIBODY: Hep C Virus Ab: <0.1 s/co ratio (ref 0.0–0.9)

## 2020-05-14 LAB — HEPATITIS B SURFACE ANTIGEN: Hepatitis B Surface Ag: NEGATIVE

## 2020-05-14 LAB — HIV ANTIBODY (ROUTINE TESTING W REFLEX): HIV Screen 4th Generation wRfx: NONREACTIVE

## 2020-05-14 LAB — BETA HCG QUANT (REF LAB): hCG Quant: 82341 m[IU]/mL

## 2020-05-14 LAB — RPR: RPR Ser Ql: NONREACTIVE

## 2020-05-14 NOTE — Progress Notes (Signed)
GYNECOLOGY PROBLEM VISIT NOTE  History:     Makayla Russell is a 35 y.o. [phone] female here to transfer care as well as gyn problem visit. Current complaints: recurrent dyspareunia, amenorrhea in setting of recent discontinuation of Depo Provera .   Patient was evaluated for dyspareunia at her previous care provider in [place]. She states she had an evaluation and TVUS and was told everything was normal. Her dyspareunia is intermittent and not associated with position.   Patient endorses starting Depo Provera for contraception 07/2019 but discontinued use after a few months due to dissatisfaction with irregular spotting and amenorrhea. She endorses 100% condom use. One partner, female, penetrative sex  Gynecologic History Patient's last menstrual period was 02/08/2019 (approximate). Contraception: condoms Last Pap: 10/2018. Results were: NILM with negative HPV   Obstetric History OB History  Gravida Para Term Preterm AB Living  8       3 5   SAB IAB Ectopic Multiple Live Births    3     5    # Outcome Date GA Lbr Len/2nd Weight Sex Delivery Anes PTL Lv  8 Gravida           7 Gravida           6 Gravida           5 Gravida           4 Gravida           3 IAB           2 IAB           1 IAB             Past Medical History:  Diagnosis Date  . Abortion    x 2  . Anemia   . Asthma   . Clot    in ovary vein 2018  . Depression   . Dysplasia of cervix    colposcopy  . GERD (gastroesophageal reflux disease)   . History of stomach ulcers     Past Surgical History:  Procedure Laterality Date  . DILATION AND CURETTAGE OF UTERUS      Current Outpatient Medications on File Prior to Visit  Medication Sig Dispense Refill  . albuterol (ACCUNEB) 1.25 MG/3ML nebulizer solution Take 1 ampule by nebulization every 6 (six) hours as needed for wheezing.    2019 linaclotide (LINZESS) 290 MCG CAPS capsule Take 1 capsule (290 mcg total) by mouth daily before breakfast. (Patient not taking:  Reported on 05/13/2020) 30 capsule 2   Current Facility-Administered Medications on File Prior to Visit  Medication Dose Route Frequency Provider Last Rate Last Admin  . 0.9 %  sodium chloride infusion  500 mL Intravenous Once Nandigam, 07/11/2020, MD        No Known Allergies  Social History:  reports that she has never smoked. She has never used smokeless tobacco. She reports current alcohol use. She reports that she does not use drugs.  Family History  Problem Relation Age of Onset  . Hypertension Mother   . Tuberculosis Maternal Grandmother   . Colon cancer Neg Hx   . Colon polyps Neg Hx     The following portions of the patient's history were reviewed and updated as appropriate: allergies, current medications, past family history, past medical history, past social history, past surgical history and problem list.  Review of Systems Pertinent items noted in HPI and remainder of comprehensive ROS otherwise negative.  Physical Exam:  BP 105/79   Pulse 93   Ht 5\' 6"  (1.676 m)   Wt 134 lb 3.2 oz (60.9 kg)   LMP 02/08/2019 (Approximate)   BMI 21.66 kg/m  CONSTITUTIONAL: Well-developed, well-nourished female in no acute distress.  HENT:  Normocephalic, atraumatic, External right and left ear normal.  EYES: Conjunctivae and EOM are normal. Pupils are equal, round, and reactive to light. No scleral icterus.  NECK: Normal range of motion, supple, no masses.    SKIN: Skin is warm and dry. No rash noted. Not diaphoretic. No erythema. No pallor. MUSCULOSKELETAL: Normal range of motion. No tenderness.  No cyanosis, clubbing, or edema.   NEUROLOGIC: Alert and oriented to person, place, and time. Normal reflexes, muscle tone coordination.  PSYCHIATRIC: Normal mood and affect. Normal behavior. Normal judgment and thought content. CARDIOVASCULAR: Normal heart rate noted, regular rhythm RESPIRATORY: Clear to auscultation bilaterally. Effort and breath sounds normal, no problems with  respiration noted. ABDOMEN: Soft, no distention noted.  No tenderness, rebound or guarding.    Assessment and Plan:    1. Screening for STD (sexually transmitted disease)  - HIV Antibody (routine testing w rflx) - RPR - Hepatitis B Surface AntiGEN - Hepatitis C Antibody  2. Positive pregnancy test - Positive urine pregnancy test in office  - Beta hCG quant (ref lab)  Originally planned well woman appointment with GYN exam but amended visit once positive pregnancy test is obtained. Patient does not plan to continue pregnancy so prenatal planning was not incorporated into visit discussion.  Routine preventative health maintenance measures emphasized. Please refer to After Visit Summary for other counseling recommendations.     Quant hCG resulted overnight and patient was notified via MyChart  04/10/2019, MSN, CNM Certified Nurse Midwife, [facility] for [facility] 05/14/20 8:11 AM

## 2020-05-15 ENCOUNTER — Telehealth: Payer: Self-pay

## 2020-05-15 ENCOUNTER — Other Ambulatory Visit: Payer: Self-pay

## 2020-05-15 ENCOUNTER — Encounter: Payer: Medicaid Other | Admitting: Gastroenterology

## 2020-05-15 NOTE — Telephone Encounter (Signed)
Patient contacted to reschedule her EGD. She accepts the date of 06/17/20 and COVID testing on 06/13/20. New instructions will be available in her My Chart portal under the "letters" tab. If an opening for an appointment sooner than 06/17/20 is available, we will contact her to see if this works better for her schedule. Patient agrees to this plan.

## 2020-06-16 ENCOUNTER — Telehealth: Payer: Self-pay | Admitting: *Deleted

## 2020-06-16 NOTE — Telephone Encounter (Signed)
Pt did not go for her COVID test on 06/13/20. Called the patient to verify this and tell her per [facility] policy her EGD will need to be rescheduled. Voicemail left.

## 2020-06-17 ENCOUNTER — Encounter: Payer: Medicaid Other | Admitting: Gastroenterology

## 2020-06-17 NOTE — Telephone Encounter (Signed)
ok 

## 2020-07-04 ENCOUNTER — Ambulatory Visit: Payer: Medicaid Other | Admitting: Advanced Practice Midwife

## 2020-07-07 ENCOUNTER — Ambulatory Visit: Payer: Medicaid Other | Admitting: Nurse Practitioner

## 2020-07-28 ENCOUNTER — Telehealth: Payer: Self-pay | Admitting: Nurse Practitioner

## 2020-07-28 ENCOUNTER — Encounter: Payer: Self-pay | Admitting: Nurse Practitioner

## 2020-07-28 NOTE — Telephone Encounter (Signed)
Pt was no show for appt 07/07/20 new pt. 1st occurrence. Fee waived. Letter mailed.

## 2020-08-03 ENCOUNTER — Encounter (HOSPITAL_COMMUNITY): Payer: Self-pay | Admitting: *Deleted

## 2020-08-03 ENCOUNTER — Other Ambulatory Visit: Payer: Self-pay

## 2020-08-03 ENCOUNTER — Ambulatory Visit (HOSPITAL_COMMUNITY)
Admission: EM | Admit: 2020-08-03 | Discharge: 2020-08-03 | Disposition: A | Discharge status: Home / Self Care | Payer: Medicaid Other | Attending: Family Medicine | Admitting: Family Medicine

## 2020-08-03 DIAGNOSIS — N760 Acute vaginitis: Secondary | ICD-10-CM

## 2020-08-03 MED ORDER — METRONIDAZOLE 500 MG PO TABS: 500.0000 mg | ORAL_TABLET | Freq: Two times a day (BID) | ORAL | 0 refills | Status: DC

## 2020-08-03 NOTE — ED Triage Notes (Signed)
PT reports vag discomfort and slight odor.

## 2020-08-03 NOTE — ED Provider Notes (Signed)
[facility]    CSN: [id number] Arrival date & time: 08/03/20  1309      History   Chief Complaint Chief Complaint  Patient presents with  . Vaginal Pain    HPI Makayla Russell is a 35 y.o. female.   Patient here today with 2-day history of mild vaginal odor and feeling "off".  She denies itching, abnormal discharge, vaginal pain, pelvic pain, nausea, vomiting, fevers, concern for STDs or pregnancy.  Does have a history of BV and states this feels similar.  Has not tried anything over-the-counter for symptoms at this time.  LMP was 07/20/2020.     Past Medical History:  Diagnosis Date  . Abortion    x 2  . Anemia   . Asthma   . Clot    in ovary vein 2018  . Depression   . Dysplasia of cervix    colposcopy  . GERD (gastroesophageal reflux disease)   . History of stomach ulcers     There are no problems to display for this patient.   Past Surgical History:  Procedure Laterality Date  . DILATION AND CURETTAGE OF UTERUS      OB History    Gravida  8   Para      Term      Preterm      AB  3   Living  5     SAB      IAB  3   Ectopic      Multiple      Live Births  5            Home Medications    Prior to Admission medications   Medication Sig Start Date End Date Taking? Authorizing Provider  metroNIDAZOLE (FLAGYL) 500 MG tablet Take 1 tablet (500 mg total) by mouth 2 (two) times daily. 08/03/20  Yes Particia Nearing, PA-C  albuterol (ACCUNEB) 1.25 MG/3ML nebulizer solution Take 1 ampule by nebulization every 6 (six) hours as needed for wheezing.    [provider]  linaclotide Karlene Einstein) 290 MCG CAPS capsule Take 1 capsule (290 mcg total) by mouth daily before breakfast. Patient not taking: Reported on 05/13/2020 04/09/20   Napoleon Form, MD    Family History Family History  Problem Relation Age of Onset  . Hypertension Mother   . Tuberculosis Maternal Grandmother   . Colon cancer Neg Hx   . Colon polyps Neg  Hx     Social History Social History   Tobacco Use  . Smoking status: Never Smoker  . Smokeless tobacco: Never Used  Vaping Use  . Vaping Use: Never used  Substance Use Topics  . Alcohol use: Yes    Comment: rare   . Drug use: No    Comment: past hx of marijuana use      Allergies   Patient has no known allergies.   Review of Systems Review of Systems Per HPI  Physical Exam Triage Vital Signs ED Triage Vitals  Enc Vitals Group     BP 08/03/20 1342 115/76     Pulse Rate 08/03/20 1342 87     Resp 08/03/20 1342 16     Temp 08/03/20 1342 98.7 F (37.1 C)     Temp Source 08/03/20 1342 Oral     SpO2 08/03/20 1342 98 %     Weight --      Height --      Head Circumference --      Peak Flow --  Pain Score 08/03/20 1340 0     Pain Loc --      Pain Edu? --      Excl. in GC? --    No data found.  Updated Vital Signs BP 115/76 (BP Location: Left Arm)   Pulse 87   Temp 98.7 F (37.1 C) (Oral)   Resp 16   LMP 07/20/2020   SpO2 98%   Visual Acuity Right Eye Distance:   Left Eye Distance:   Bilateral Distance:    Right Eye Near:   Left Eye Near:    Bilateral Near:     Physical Exam Vitals and nursing note reviewed.  Constitutional:      Appearance: Normal appearance. She is not ill-appearing.  HENT:     Head: Atraumatic.  Eyes:     Extraocular Movements: Extraocular movements intact.     Conjunctiva/sclera: Conjunctivae normal.  Cardiovascular:     Rate and Rhythm: Normal rate and regular rhythm.     Heart sounds: Normal heart sounds.  Pulmonary:     Effort: Pulmonary effort is normal.     Breath sounds: Normal breath sounds.  Abdominal:     General: Bowel sounds are normal. There is no distension.     Palpations: Abdomen is soft.     Tenderness: There is no abdominal tenderness. There is no right CVA tenderness, left CVA tenderness or guarding.  Genitourinary:    Comments: GU exam deferred, self swab performed Musculoskeletal:         General: Normal range of motion.     Cervical back: Normal range of motion and neck supple.  Skin:    General: Skin is warm and dry.  Neurological:     Mental Status: She is alert and oriented to person, place, and time.  Psychiatric:        Mood and Affect: Mood normal.        Thought Content: Thought content normal.        Judgment: Judgment normal.      UC Treatments / Results  Labs (all labs ordered are listed, but only abnormal results are displayed) Labs Reviewed  CERVICOVAGINAL ANCILLARY ONLY    EKG   Radiology No results found.  Procedures Procedures (including critical care time)  Medications Ordered in UC Medications - No data to display  Initial Impression / Assessment and Plan / UC Course  I have reviewed the triage vital signs and the nursing notes.  Pertinent labs & imaging results that were available during my care of the patient were reviewed by me and considered in my medical decision making (see chart for details).     Vaginal swab pending, symptoms consistent with BV which she has a history of recurrent infections with.  Will start Flagyl course while awaiting lab results at this time.  Discussed in good supportive over-the-counter care and things to avoid for prevention.  Follow-up with gynecology if continuing.  Final Clinical Impressions(s) / UC Diagnoses   Final diagnoses:  Acute vaginitis   Discharge Instructions   None    ED Prescriptions    Medication Sig Dispense Auth. Provider   metroNIDAZOLE (FLAGYL) 500 MG tablet Take 1 tablet (500 mg total) by mouth 2 (two) times daily. 14 tablet Particia Nearing, [place]     PDMP not reviewed this encounter.   Particia Nearing, [place] 08/03/20 1621

## 2020-08-04 LAB — CERVICOVAGINAL ANCILLARY ONLY
Bacterial Vaginitis (gardnerella): POSITIVE — AB
Candida Glabrata: NEGATIVE
Candida Vaginitis: NEGATIVE
Chlamydia: NEGATIVE
Comment: NEGATIVE
Comment: NEGATIVE
Comment: NEGATIVE
Comment: NEGATIVE
Comment: NEGATIVE
Comment: NORMAL
Neisseria Gonorrhea: NEGATIVE
Trichomonas: NEGATIVE

## 2020-08-13 ENCOUNTER — Telehealth (HOSPITAL_COMMUNITY): Payer: Self-pay | Admitting: Emergency Medicine

## 2020-08-13 MED ORDER — METRONIDAZOLE 0.75 % VA GEL: 1.0000 | Freq: Every day | VAGINAL | 0 refills | Status: AC

## 2020-08-13 NOTE — Telephone Encounter (Signed)
Patient called and states she can not tolerate oral flagyl.  Will send metrogel, per protocol

## 2020-08-27 ENCOUNTER — Ambulatory Visit: Payer: Medicaid Other | Admitting: Certified Nurse Midwife

## 2020-09-06 ENCOUNTER — Ambulatory Visit (HOSPITAL_COMMUNITY)
Admission: EM | Admit: 2020-09-06 | Discharge: 2020-09-06 | Disposition: A | Discharge status: Home / Self Care | Payer: Medicaid Other | Attending: Urgent Care | Admitting: Urgent Care

## 2020-09-06 ENCOUNTER — Encounter (HOSPITAL_COMMUNITY): Payer: Self-pay

## 2020-09-06 ENCOUNTER — Other Ambulatory Visit: Payer: Self-pay

## 2020-09-06 DIAGNOSIS — Z7251 High risk heterosexual behavior: Secondary | ICD-10-CM

## 2020-09-06 DIAGNOSIS — N898 Other specified noninflammatory disorders of vagina: Secondary | ICD-10-CM

## 2020-09-06 LAB — POC URINE PREG, ED: Preg Test, Ur: NEGATIVE

## 2020-09-06 LAB — HIV ANTIBODY (ROUTINE TESTING W REFLEX): HIV Screen 4th Generation wRfx: NONREACTIVE

## 2020-09-06 MED ORDER — CLINDAMYCIN HCL 300 MG PO CAPS: 300.0000 mg | ORAL_CAPSULE | Freq: Two times a day (BID) | ORAL | 0 refills | Status: DC

## 2020-09-06 NOTE — ED Provider Notes (Signed)
[facility]   MRN: [id number] DOB: 04/11/1986  Subjective:   Makayla Russell is a 35 y.o. female presenting for several day history of recurrent vignal discharge that is thick, white.  Patient is sexually active with 1 female partner, does not use condoms for protection.  No contraception.  Last cycle was at the beginning of April and was regular. Denies fever, n/v, abdominal pain, pelvic pain, rashes, dysuria, urinary frequency, hematuria.  Would like to make sure that she does not have any sexually transmitted infections at all.  She is also had trouble with bacterial vaginosis, has undergone treatment with metronidazole gel and oral but would like to be considered for something else.    Current Facility-Administered Medications:  .  0.9 %  sodium chloride infusion, 500 mL, Intravenous, Once, Nandigam, Kavitha V, MD  Current Outpatient Medications:  .  albuterol (ACCUNEB) 1.25 MG/3ML nebulizer solution, Take 1 ampule by nebulization every 6 (six) hours as needed for wheezing., Disp: , Rfl:  .  linaclotide (LINZESS) 290 MCG CAPS capsule, Take 1 capsule (290 mcg total) by mouth daily before breakfast. (Patient not taking: Reported on 05/13/2020), Disp: 30 capsule, Rfl: 2   No Known Allergies  Past Medical History:  Diagnosis Date  . Abortion    x 2  . Anemia   . Asthma   . Clot    in ovary vein 2018  . Depression   . Dysplasia of cervix    colposcopy  . GERD (gastroesophageal reflux disease)   . History of stomach ulcers      Past Surgical History:  Procedure Laterality Date  . DILATION AND CURETTAGE OF UTERUS      Family History  Problem Relation Age of Onset  . Hypertension Mother   . Tuberculosis Maternal Grandmother   . Colon cancer Neg Hx   . Colon polyps Neg Hx     Social History   Tobacco Use  . Smoking status: Never Smoker  . Smokeless tobacco: Never Used  Vaping Use  . Vaping Use: Never used  Substance Use Topics  . Alcohol use: Yes     Comment: rare   . Drug use: No    Comment: past hx of marijuana use     ROS   Objective:   Vitals: BP 106/69 (BP Location: Left Arm)   Pulse 69   Temp 98.1 F (36.7 C) (Oral)   Resp 16   LMP 08/08/2020   SpO2 100%   Physical Exam Constitutional:      General: She is not in acute distress.    Appearance: Normal appearance. She is well-developed. She is not ill-appearing, toxic-appearing or diaphoretic.  HENT:     Head: Normocephalic and atraumatic.     Right Ear: External ear normal.     Left Ear: External ear normal.     Nose: Nose normal.     Mouth/Throat:     Mouth: Mucous membranes are moist.     Pharynx: Oropharynx is clear.  Eyes:     General: No scleral icterus.       Right eye: No discharge.        Left eye: No discharge.     Extraocular Movements: Extraocular movements intact.     Conjunctiva/sclera: Conjunctivae normal.     Pupils: Pupils are equal, round, and reactive to light.  Cardiovascular:     Rate and Rhythm: Normal rate.  Pulmonary:     Effort: Pulmonary effort is normal.  Abdominal:  General: Bowel sounds are normal. There is no distension.     Palpations: Abdomen is soft. There is no mass.     Tenderness: There is no abdominal tenderness. There is no right CVA tenderness, left CVA tenderness, guarding or rebound.  Skin:    General: Skin is warm and dry.  Neurological:     General: No focal deficit present.     Mental Status: She is alert and oriented to person, place, and time.     Motor: No weakness.     Coordination: Coordination normal.     Gait: Gait normal.     Deep Tendon Reflexes: Reflexes normal.  Psychiatric:        Mood and Affect: Mood normal.        Behavior: Behavior normal.        Thought Content: Thought content normal.        Judgment: Judgment normal.     Results for orders placed or performed during the hospital encounter of 09/06/20 (from the past 24 hour(s))  POC urine pregnancy     Status: None   Collection  Time: 09/06/20 12:43 PM  Result Value Ref Range   Preg Test, Ur NEGATIVE NEGATIVE    Assessment and Plan :   PDMP not reviewed this encounter.  1. Vaginal discharge   2. Unprotected sex     Labs pending, will cover for recurrent bacterial vaginosis with metronidazole.  Otherwise, will treat as appropriate based off of her lab results. Counseled patient on potential for adverse effects with medications prescribed/recommended today, ER and return-to-clinic precautions discussed, patient verbalized understanding.    Wallis Bamberg, PA-C 09/06/20 1259

## 2020-09-06 NOTE — ED Triage Notes (Addendum)
Pt present vaginal irration with a foul odor and thick chalk of discharge. Symptoms did not get any better after she completed the medication from previous visit.  Pt would like a full std panel.

## 2020-09-07 LAB — RPR: RPR Ser Ql: NONREACTIVE

## 2020-09-08 ENCOUNTER — Other Ambulatory Visit: Payer: Self-pay

## 2020-09-08 ENCOUNTER — Ambulatory Visit (AMBULATORY_SURGERY_CENTER): Payer: Medicaid Other

## 2020-09-08 VITALS — Ht 66.0 in | Wt 134.0 lb

## 2020-09-08 DIAGNOSIS — K219 Gastro-esophageal reflux disease without esophagitis: Secondary | ICD-10-CM

## 2020-09-08 DIAGNOSIS — K279 Peptic ulcer, site unspecified, unspecified as acute or chronic, without hemorrhage or perforation: Secondary | ICD-10-CM

## 2020-09-08 DIAGNOSIS — Z8619 Personal history of other infectious and parasitic diseases: Secondary | ICD-10-CM

## 2020-09-08 LAB — CERVICOVAGINAL ANCILLARY ONLY
Bacterial Vaginitis (gardnerella): NEGATIVE
Candida Glabrata: NEGATIVE
Candida Vaginitis: POSITIVE — AB
Chlamydia: NEGATIVE
Comment: NEGATIVE
Comment: NEGATIVE
Comment: NEGATIVE
Comment: NEGATIVE
Comment: NEGATIVE
Comment: NORMAL
Neisseria Gonorrhea: NEGATIVE
Trichomonas: NEGATIVE

## 2020-09-08 NOTE — Progress Notes (Signed)
Pre visit completed via phone call; Patient verified name, DOB, and address; No egg or soy allergy known to patient  No issues with past sedation with any surgeries or procedures Patient denies ever being told they had issues or difficulty with intubation  No FH of Malignant Hyperthermia No diet pills per patient No home 02 use per patient  No blood thinners per patient  Pt denies issues with constipation  No A fib or A flutter  EMMI video via MyChart  COVID 19 guidelines implemented in PV today with Pt and RN  Due to the COVID-19 pandemic we are asking patients to follow certain guidelines. Pt aware of COVID protocols and [facility] guidelines   

## 2020-09-09 ENCOUNTER — Telehealth (HOSPITAL_COMMUNITY): Payer: Self-pay | Admitting: Emergency Medicine

## 2020-09-09 MED ORDER — FLUCONAZOLE 150 MG PO TABS: 150.0000 mg | ORAL_TABLET | Freq: Once | ORAL | 0 refills | Status: AC

## 2020-09-24 ENCOUNTER — Encounter: Payer: Self-pay | Admitting: Gastroenterology

## 2020-09-24 ENCOUNTER — Ambulatory Visit (AMBULATORY_SURGERY_CENTER): Payer: Medicaid Other | Admitting: Gastroenterology

## 2020-09-24 ENCOUNTER — Other Ambulatory Visit: Payer: Self-pay

## 2020-09-24 VITALS — BP 114/68 | HR 73 | Temp 98.1°F | Resp 11 | Ht 66.0 in | Wt 134.0 lb

## 2020-09-24 DIAGNOSIS — Z8619 Personal history of other infectious and parasitic diseases: Secondary | ICD-10-CM

## 2020-09-24 DIAGNOSIS — K2950 Unspecified chronic gastritis without bleeding: Secondary | ICD-10-CM

## 2020-09-24 DIAGNOSIS — K219 Gastro-esophageal reflux disease without esophagitis: Secondary | ICD-10-CM

## 2020-09-24 DIAGNOSIS — K279 Peptic ulcer, site unspecified, unspecified as acute or chronic, without hemorrhage or perforation: Secondary | ICD-10-CM

## 2020-09-24 MED ORDER — SODIUM CHLORIDE 0.9 % IV SOLN
500.0000 mL | Freq: Once | INTRAVENOUS | Status: DC
Start: 2020-09-24 — End: 2020-09-24

## 2020-09-24 NOTE — Patient Instructions (Signed)
YOU HAD AN ENDOSCOPIC PROCEDURE TODAY AT THE [facility]:   Refer to the procedure report that was given to you for any specific questions about what was found during the examination.  If the procedure report does not answer your questions, please call your gastroenterologist to clarify.  If you requested that your care partner not be given the details of your procedure findings, then the procedure report has been included in a sealed envelope for you to review at your convenience later.  YOU SHOULD EXPECT: Some feelings of bloating in the abdomen. Passage of more gas than usual.  Walking can help get rid of the air that was put into your GI tract during the procedure and reduce the bloating. If you had a lower endoscopy (such as a colonoscopy or flexible sigmoidoscopy) you may notice spotting of blood in your stool or on the toilet paper. If you underwent a bowel prep for your procedure, you may not have a normal bowel movement for a few days.  Please Note:  You might notice some irritation and congestion in your nose or some drainage.  This is from the oxygen used during your procedure.  There is no need for concern and it should clear up in a day or so.  SYMPTOMS TO REPORT IMMEDIATELY:   Following upper endoscopy (EGD)  Vomiting of blood or coffee ground material  New chest pain or pain under the shoulder blades  Painful or persistently difficult swallowing  New shortness of breath  Fever of 100F or higher  Black, tarry-looking stools  For urgent or emergent issues, a gastroenterologist can be reached at any hour by calling [phone]. Do not use MyChart messaging for urgent concerns.    DIET:  We do recommend a small meal at first, but then you may proceed to your regular diet.  Drink plenty of fluids but you should avoid alcoholic beverages for 24 hours.  ACTIVITY:  You should plan to take it easy for the rest of today and you should NOT DRIVE or use heavy machinery  until tomorrow (because of the sedation medicines used during the test).    FOLLOW UP: Our staff will call the number listed on your records 48-72 hours following your procedure to check on you and address any questions or concerns that you may have regarding the information given to you following your procedure. If we do not reach you, we will leave a message.  We will attempt to reach you two times.  During this call, we will ask if you have developed any symptoms of COVID 19. If you develop any symptoms (ie: fever, flu-like symptoms, shortness of breath, cough etc.) before then, please call [phone].  If you test positive for Covid 19 in the 2 weeks post procedure, please call and report this information to us.    If any biopsies were taken you will be contacted by phone or by letter within the next 1-3 weeks.  Please call us at [phone] if you have not heard about the biopsies in 3 weeks.    SIGNATURES/CONFIDENTIALITY: You and/or your care partner have signed paperwork which will be entered into your electronic medical record.  These signatures attest to the fact that that the information above on your After Visit Summary has been reviewed and is understood.  Full responsibility of the confidentiality of this discharge information lies with you and/or your care-partner.    Resume medications. 

## 2020-09-24 NOTE — Progress Notes (Signed)
VS-CW  Pt's states no medical or surgical changes since previsit or office visit.  

## 2020-09-24 NOTE — Progress Notes (Signed)
pt tolerated well. VSS. awake and to recovery. Report given to RN. Bite block left insitu to recovery. Lower lip piercing intact. No signs of trauma.

## 2020-09-24 NOTE — Op Note (Signed)
[facility] Patient Name: Makayla Russell Procedure Date: 09/24/2020 9:32 AM MRN: [id number] Endoscopist: Napoleon Form , MD Age: 35 Referring MD:  Date of Birth: 07-18-1985 Gender: Female Account #: [id number] Procedure:                Upper GI endoscopy Indications:              Epigastric abdominal pain, Esophageal reflux                            symptoms that persist despite appropriate therapy,                            Follow-up of peptic ulcer Medicines:                Monitored Anesthesia Care Procedure:                Pre-Anesthesia Assessment:                           - Prior to the procedure, a History and Physical                            was performed, and patient medications and                            allergies were reviewed. The patient's tolerance of                            previous anesthesia was also reviewed. The risks                            and benefits of the procedure and the sedation                            options and risks were discussed with the patient.                            All questions were answered, and informed consent                            was obtained. Prior Anticoagulants: The patient has                            taken no previous anticoagulant or antiplatelet                            agents. ASA Grade Assessment: II - A patient with                            mild systemic disease. After reviewing the risks                            and benefits, the patient was deemed in  satisfactory condition to undergo the procedure.                           After obtaining informed consent, the endoscope was                            passed under direct vision. Throughout the                            procedure, the patient's blood pressure, pulse, and                            oxygen saturations were monitored continuously. The                            Endoscope was introduced  through the mouth, and                            advanced to the second part of duodenum. The upper                            GI endoscopy was accomplished without difficulty.                            The patient tolerated the procedure well. Scope In: Scope Out: Findings:                 The Z-line was regular and was found 38 cm from the                            incisors.                           The examined esophagus was normal.                           The gastroesophageal flap valve was visualized                            endoscopically and classified as Hill Grade II                            (fold present, opens with respiration).                           The stomach was normal. Biopsies were taken with a                            cold forceps for Helicobacter pylori testing.                           The cardia and gastric fundus were normal on                            retroflexion.  The examined duodenum was normal. Complications:            No immediate complications. Estimated Blood Loss:     Estimated blood loss was minimal. Impression:               - Z-line regular, 38 cm from the incisors.                           - Normal esophagus.                           - Gastroesophageal flap valve classified as Hill                            Grade II (fold present, opens with respiration).                           - Normal stomach. Biopsied.                           - Normal examined duodenum. Recommendation:           - Patient has a contact number available for                            emergencies. The signs and symptoms of potential                            delayed complications were discussed with the                            patient. Return to normal activities tomorrow.                            Written discharge instructions were provided to the                            patient.                           - Resume  previous diet.                           - Continue present medications.                           - Await pathology results. Napoleon Form, MD 09/24/2020 10:08:25 AM This report has been signed electronically.

## 2020-09-26 ENCOUNTER — Telehealth: Payer: Self-pay

## 2020-09-26 NOTE — Telephone Encounter (Signed)
Left message on answering machine. 

## 2020-09-30 ENCOUNTER — Encounter: Payer: Self-pay | Admitting: Gastroenterology

## 2020-11-16 ENCOUNTER — Encounter (HOSPITAL_COMMUNITY): Payer: Self-pay

## 2020-11-16 ENCOUNTER — Ambulatory Visit (HOSPITAL_COMMUNITY)
Admission: EM | Admit: 2020-11-16 | Discharge: 2020-11-16 | Disposition: A | Discharge status: Home / Self Care | Payer: Medicaid Other | Attending: Emergency Medicine | Admitting: Emergency Medicine

## 2020-11-16 ENCOUNTER — Other Ambulatory Visit: Payer: Self-pay

## 2020-11-16 DIAGNOSIS — R197 Diarrhea, unspecified: Secondary | ICD-10-CM

## 2020-11-16 MED ORDER — DICYCLOMINE HCL 20 MG PO TABS: 20.0000 mg | ORAL_TABLET | Freq: Two times a day (BID) | ORAL | 0 refills | Status: DC

## 2020-11-16 NOTE — ED Provider Notes (Addendum)
[facility]    CSN: [id number] Arrival date & time: 11/16/20  1521      History   Chief Complaint Chief Complaint  Patient presents with   Diarrhea   Abdominal Pain    HPI Makayla Russell is a 35 y.o. female.   Patient here for evaluation of diarrhea that has been ongoing for the past week.  Reports symptoms initially began to improve but started to get worse again yesterday.  Reports having intermittent abdominal cramping and nausea but denies any vomiting.  Reports bowel movements occurring up to 10 times a day and states that they are very liquid.  Has not taken any OTC medications or treatments.  Patient does not have well water.  Denies any trauma, injury, or other precipitating event.  Denies any specific alleviating or aggravating factors.  Denies any fevers, chest pain, shortness of breath, numbness, tingling, weakness, or headaches.     The history is provided by the patient.  Diarrhea Associated symptoms: abdominal pain   Associated symptoms: no fever and no vomiting   Abdominal Pain Associated symptoms: diarrhea and nausea   Associated symptoms: no fever and no vomiting    Past Medical History:  Diagnosis Date   Abortion    x 2   Anemia    hx of   Asthma    uses inhaler PRN   Blood transfusion without reported diagnosis 2021   x 2    Clot    in ovary vein 2018   Depression    hx of   Dysplasia of cervix    colposcopy   GERD (gastroesophageal reflux disease)    uses OTC PRN   History of stomach ulcers     There are no problems to display for this patient.   Past Surgical History:  Procedure Laterality Date   DILATION AND CURETTAGE OF UTERUS     INDUCED ABORTION      OB History     Gravida  8   Para      Term      Preterm      AB  3   Living  5      SAB      IAB  3   Ectopic      Multiple      Live Births  5            Home Medications    Prior to Admission medications   Medication Sig Start Date End Date  Taking? Authorizing Provider  albuterol (ACCUNEB) 1.25 MG/3ML nebulizer solution Take 1 ampule by nebulization every 6 (six) hours as needed for wheezing.   Yes [provider]  dicyclomine (BENTYL) 20 MG tablet Take 1 tablet (20 mg total) by mouth 2 (two) times daily. 11/16/20  Yes Ivette Loyal, NP  linaclotide Karlene Einstein) 290 MCG CAPS capsule Take 1 capsule (290 mcg total) by mouth daily before breakfast. Patient not taking: No sig reported 04/09/20   Napoleon Form, MD    Family History Family History  Problem Relation Age of Onset   Hypertension Mother    Tuberculosis Maternal Grandmother    Colon cancer Neg Hx    Colon polyps Neg Hx    Esophageal cancer Neg Hx    Rectal cancer Neg Hx    Stomach cancer Neg Hx     Social History Social History   Tobacco Use   Smoking status: Never   Smokeless tobacco: Never  Vaping Use  Vaping Use: Never used  Substance Use Topics   Alcohol use: Yes    Comment: 1-2 times per month   Drug use: No    Comment: past hx of marijuana use      Allergies   Patient has no known allergies.   Review of Systems Review of Systems  Constitutional:  Negative for fever.  Gastrointestinal:  Positive for abdominal pain, diarrhea and nausea. Negative for vomiting.  All other systems reviewed and are negative.   Physical Exam Triage Vital Signs ED Triage Vitals  Enc Vitals Group     BP 11/16/20 1545 128/72     Pulse Rate 11/16/20 1545 71     Resp 11/16/20 1545 18     Temp 11/16/20 1545 98.9 F (37.2 C)     Temp src --      SpO2 11/16/20 1545 100 %     Weight --      Height --      Head Circumference --      Peak Flow --      Pain Score 11/16/20 1542 0     Pain Loc --      Pain Edu? --      Excl. in GC? --    No data found.  Updated Vital Signs BP 128/72   Pulse 71   Temp 98.9 F (37.2 C)   Resp 18   LMP 11/02/2020 (Approximate)   SpO2 100%   Visual Acuity Right Eye Distance:   Left Eye Distance:   Bilateral  Distance:    Right Eye Near:   Left Eye Near:    Bilateral Near:     Physical Exam Vitals and nursing note reviewed.  Constitutional:      General: She is not in acute distress.    Appearance: Normal appearance. She is not ill-appearing, toxic-appearing or diaphoretic.  HENT:     Head: Normocephalic and atraumatic.  Eyes:     Conjunctiva/sclera: Conjunctivae normal.  Cardiovascular:     Rate and Rhythm: Normal rate.     Pulses: Normal pulses.  Pulmonary:     Effort: Pulmonary effort is normal.  Abdominal:     General: Abdomen is flat. Bowel sounds are normal.     Palpations: Abdomen is soft.     Tenderness: There is no abdominal tenderness.  Musculoskeletal:        General: Normal range of motion.     Cervical back: Normal range of motion.  Skin:    General: Skin is warm and dry.  Neurological:     General: No focal deficit present.     Mental Status: She is alert and oriented to person, place, and time.  Psychiatric:        Mood and Affect: Mood normal.     UC Treatments / Results  Labs (all labs ordered are listed, but only abnormal results are displayed) Labs Reviewed - No data to display  EKG   Radiology No results found.  Procedures Procedures (including critical care time)  Medications Ordered in UC Medications - No data to display  Initial Impression / Assessment and Plan / UC Course  I have reviewed the triage vital signs and the nursing notes.  Pertinent labs & imaging results that were available during my care of the patient were reviewed by me and considered in my medical decision making (see chart for details).    Assessment negative for red flags or concerns.  Diarrhea.  May take Bentyl twice a  day as needed for the next 10 days to help with abdominal cramping.  Recommend Imodium or Pepto-Bismol as needed.  Encourage plenty of fluids and rest.  Recommend following up with GI doctor soon as possible for reevaluation.  If symptoms do not improve  recommend going to the emergency room for further evaluation.  Follow-up with primary care as needed. Final Clinical Impressions(s) / UC Diagnoses   Final diagnoses:  Diarrhea, unspecified type     Discharge Instructions      Take the Bentyl twice a day for the next 10 days.  You can try Imodium or Pepto-Bismol to help with diarrhea.  Make sure that you are drinking plenty of fluids, especially water or gatorade/powerade/pedialyte.   Follow up with your GI doctor as soon as possible.   If your symptoms do not improve or get worse over the next few days, go to the emergency department for further evaluation.       ED Prescriptions     Medication Sig Dispense Auth. Provider   dicyclomine (BENTYL) 20 MG tablet Take 1 tablet (20 mg total) by mouth 2 (two) times daily. 20 tablet Ivette Loyal, NP      PDMP not reviewed this encounter.   Ivette Loyal, NP 11/16/20 1628    Ivette Loyal, NP 11/16/20 [phone]

## 2020-11-16 NOTE — Discharge Instructions (Addendum)
Take the Bentyl twice a day for the next 10 days.  You can try Imodium or Pepto-Bismol to help with diarrhea.  Make sure that you are drinking plenty of fluids, especially water or gatorade/powerade/pedialyte.   Follow up with your GI doctor as soon as possible.   If your symptoms do not improve or get worse over the next few days, go to the emergency department for further evaluation.

## 2020-11-16 NOTE — ED Triage Notes (Signed)
Pt c/o diarrhea, frequent bowel movements for about 6 days, worsening yesterday (every 5-10 mins). Also reports intermittent cramping pain in abdomen.  Pt also reports feeling dehydrated (dry mouth), mild nausea.

## 2020-11-17 ENCOUNTER — Telehealth: Payer: Self-pay | Admitting: Gastroenterology

## 2020-11-17 ENCOUNTER — Other Ambulatory Visit: Payer: Self-pay

## 2020-11-17 DIAGNOSIS — R197 Diarrhea, unspecified: Secondary | ICD-10-CM

## 2020-11-17 NOTE — Telephone Encounter (Signed)
DOD Patient of Dr Elana Alm.  Spoke with the patient. She has been experiencing daily diarrhea and mucous stools since 11/10/20. She does not know exactly how many times a day she has a bowel movement but she does know it is more than 4 times a day. No recent antibiotics, no fevers, and no known sick contacts. She has not eaten today, because once she eats, the cramping and diarrhea will start again. She has been off of the Linzess fro more than 6 months. She has not taken Bentyl. Please advise.

## 2020-11-17 NOTE — Telephone Encounter (Signed)
Discussed with the patient. She agrees to this plan of care. 

## 2020-11-17 NOTE — Telephone Encounter (Signed)
Inbound call from pt requesting a call back stating that she has been having diarrhea since 11/20/20. Please advise. Thanks

## 2020-11-17 NOTE — Telephone Encounter (Signed)
Upper endoscopy 09/24/2020 for GERD  Last office visit December 2021 for GERD and chronic constipation  Visit to urgent care yesterday, no apparent testing done.  Was prescribed Bentyl  Diarrhea sounds likely infectious.  Recommend GI pathogen panel and to take the Bentyl as prescribed and directed at urgent care yesterday.  - HD

## 2020-12-24 ENCOUNTER — Ambulatory Visit (INDEPENDENT_AMBULATORY_CARE_PROVIDER_SITE_OTHER): Payer: Medicaid Other | Admitting: Family Medicine

## 2020-12-24 ENCOUNTER — Encounter: Payer: Self-pay | Admitting: Family Medicine

## 2020-12-24 ENCOUNTER — Other Ambulatory Visit: Payer: Self-pay

## 2020-12-24 VITALS — BP 111/74 | HR 80 | Ht 66.0 in | Wt 132.0 lb

## 2020-12-24 DIAGNOSIS — Z113 Encounter for screening for infections with a predominantly sexual mode of transmission: Secondary | ICD-10-CM

## 2020-12-24 DIAGNOSIS — R102 Pelvic and perineal pain: Secondary | ICD-10-CM

## 2020-12-24 DIAGNOSIS — Z3042 Encounter for surveillance of injectable contraceptive: Secondary | ICD-10-CM

## 2020-12-24 DIAGNOSIS — Z3202 Encounter for pregnancy test, result negative: Secondary | ICD-10-CM

## 2020-12-24 LAB — POCT PREGNANCY, URINE: Preg Test, Ur: NEGATIVE

## 2020-12-24 MED ORDER — MEDROXYPROGESTERONE ACETATE 150 MG/ML IM SUSP
150.0000 mg | Freq: Once | INTRAMUSCULAR | Status: AC
  Administered 2020-12-24: 150 mg via INTRAMUSCULAR

## 2020-12-24 NOTE — Progress Notes (Signed)
   Subjective:    Patient ID: Makayla Russell is a 35 y.o. female presenting with Pelvic Pain  on 12/24/2020  HPI: Wants STD screening. She also wants to resume depo. Needs pap in December. Has LM abdominal pain. No relation to her cycle. Has daily. Thinks foods make it worse. Worked up by GI and had H. Pylori and an ulcer--treated and now they found nothing else to be wrong. Asked to see GYN. On her cycle today--declines pap/pelvic   Review of Systems  Constitutional:  Negative for chills and fever.  Respiratory:  Negative for shortness of breath.   Cardiovascular:  Negative for chest pain.  Gastrointestinal:  Negative for abdominal pain, nausea and vomiting.  Genitourinary:  Negative for dysuria.  Skin:  Negative for rash.     Objective:    BP 111/74   Pulse 80   Ht 5\' 6"  (1.676 m)   Wt 132 lb (59.9 kg)   LMP 12/23/2020 (Exact Date)   BMI 21.31 kg/m  Physical Exam Constitutional:      General: She is not in acute distress.    Appearance: She is well-developed.  HENT:     Head: Normocephalic and atraumatic.  Eyes:     General: No scleral icterus. Cardiovascular:     Rate and Rhythm: Normal rate.  Pulmonary:     Effort: Pulmonary effort is normal.  Abdominal:     Palpations: Abdomen is soft.  Musculoskeletal:     Cervical back: Neck supple.  Skin:    General: Skin is warm and dry.  Neurological:     Mental Status: She is alert and oriented to person, place, and time.        Assessment & Plan:   Problem List Items Addressed This Visit       Unprioritized   Pelvic pain    Check u/s though location of pain is not typical for GYN pathology.      Relevant Orders   12/25/2020 PELVIC COMPLETE WITH TRANSVAGINAL   Other Visit Diagnoses     Screen for STD (sexually transmitted disease)    -  Primary   Relevant Orders   RPR (Completed)   HIV Antibody (routine testing w rflx) (Completed)   Hepatitis B Surface AntiGEN (Completed)   Hepatitis C Antibody (Completed)    Encounter for surveillance of injectable contraceptive       resume her depo   Relevant Medications   medroxyPROGESTERone (DEPO-PROVERA) injection 150 mg (Completed)       Return in about 3 months (around 03/26/2021) for a CPE.  03/28/2021 12/24/2020 4:33 PM

## 2020-12-25 DIAGNOSIS — R102 Pelvic and perineal pain: Secondary | ICD-10-CM | POA: Insufficient documentation

## 2020-12-25 LAB — RPR: RPR Ser Ql: NONREACTIVE

## 2020-12-25 LAB — HEPATITIS B SURFACE ANTIGEN: Hepatitis B Surface Ag: NEGATIVE

## 2020-12-25 LAB — HEPATITIS C ANTIBODY: Hep C Virus Ab: 0.1 {s_co_ratio} (ref 0.0–0.9)

## 2020-12-25 LAB — HIV ANTIBODY (ROUTINE TESTING W REFLEX): HIV Screen 4th Generation wRfx: NONREACTIVE

## 2020-12-25 NOTE — Assessment & Plan Note (Signed)
Check u/s though location of pain is not typical for GYN pathology.

## 2020-12-31 ENCOUNTER — Inpatient Hospital Stay: Admission: RE | Admit: 2020-12-31 | Payer: Medicaid Other | Source: Ambulatory Visit

## 2021-01-01 ENCOUNTER — Ambulatory Visit: Payer: Medicaid Other | Attending: Family Medicine

## 2021-01-22 ENCOUNTER — Ambulatory Visit (HOSPITAL_BASED_OUTPATIENT_CLINIC_OR_DEPARTMENT_OTHER): Payer: Medicaid Other

## 2021-01-23 ENCOUNTER — Ambulatory Visit (HOSPITAL_BASED_OUTPATIENT_CLINIC_OR_DEPARTMENT_OTHER)
Admission: RE | Admit: 2021-01-23 | Discharge: 2021-01-23 | Disposition: A | Discharge status: Home / Self Care | Payer: Medicaid Other | Source: Ambulatory Visit | Attending: Family Medicine | Admitting: Family Medicine

## 2021-01-23 ENCOUNTER — Other Ambulatory Visit: Payer: Self-pay

## 2021-01-23 DIAGNOSIS — R102 Pelvic and perineal pain: Secondary | ICD-10-CM | POA: Diagnosis present

## 2021-03-11 ENCOUNTER — Encounter: Payer: Self-pay | Admitting: Family Medicine

## 2021-03-11 ENCOUNTER — Telehealth (INDEPENDENT_AMBULATORY_CARE_PROVIDER_SITE_OTHER): Payer: Medicaid Other | Admitting: Family Medicine

## 2021-03-11 DIAGNOSIS — Z30011 Encounter for initial prescription of contraceptive pills: Secondary | ICD-10-CM | POA: Diagnosis not present

## 2021-03-11 DIAGNOSIS — R102 Pelvic and perineal pain: Secondary | ICD-10-CM

## 2021-03-11 MED ORDER — XULANE 150-35 MCG/24HR TD PTWK: 1.0000 | MEDICATED_PATCH | TRANSDERMAL | 3 refills | Status: DC

## 2021-03-11 NOTE — Progress Notes (Signed)
I connected with  Makayla Russell on 03/11/21 at 11:15 AM EDT by MyChart Virtual Video Visit and verified that I am speaking with the correct person using two identifiers.   I discussed the limitations, risks, security and privacy concerns of performing an evaluation and management service by telephone and the availability of in person appointments. I also discussed with the patient that there may be a patient responsible charge related to this service. The patient expressed understanding and agreed to proceed.  Guy Begin, CMA 03/11/2021  11:33 AM

## 2021-03-11 NOTE — Progress Notes (Deleted)
    GYNECOLOGY VIRTUAL VISIT ENCOUNTER NOTE  Provider location: Center for [facility]   Patient location: Home  I connected with Makayla Russell on 03/11/21 at 11:15 AM EDT by MyChart Video Encounter and verified that I am speaking with the correct person using two identifiers.   I discussed the limitations, risks, security and privacy concerns of performing an evaluation and management service virtually and the availability of in person appointments. I also discussed with the patient that there may be a patient responsible charge related to this service. The patient expressed understanding and agreed to proceed.   History:  Makayla Russell is a 35 y.o. [phone] female being evaluated today for f/u after u/s. Has h/o pelvic pain which has been on-going. Also reports new symptoms of vaginal dryness She denies any abnormal vaginal discharge, bleeding, pelvic pain or other concerns.       Past Medical History:  Diagnosis Date   Abortion    x 2   Anemia    hx of   Asthma    uses inhaler PRN   Blood transfusion without reported diagnosis 2021   x 2    Clot    in ovary vein 2018   Depression    hx of   Dysplasia of cervix    colposcopy   GERD (gastroesophageal reflux disease)    uses OTC PRN   History of stomach ulcers    Past Surgical History:  Procedure Laterality Date   DILATION AND CURETTAGE OF UTERUS     INDUCED ABORTION     The following portions of the patient's history were reviewed and updated as appropriate: allergies, current medications, past family history, past medical history, past social history, past surgical history and problem list.   Health Maintenance:  Normal pap and negative HRHPV on 04/2018.  Normal mammogram on ***.   Review of Systems:  Pertinent items noted in HPI and remainder of comprehensive ROS otherwise negative.  Physical Exam:   General:  Alert, oriented and cooperative. Patient appears to be in no acute distress.   Mental Status: Normal mood and affect. Normal behavior. Normal judgment and thought content.   Respiratory: Normal respiratory effort, no problems with respiration noted  Rest of physical exam deferred due to type of encounter  Labs and Imaging No results found for this or any previous visit (from the past 336 hour(s)). No results found.     Assessment and Plan:     There are no diagnoses linked to this encounter.      I discussed the assessment and treatment plan with the patient. The patient was provided an opportunity to ask questions and all were answered. The patient agreed with the plan and demonstrated an understanding of the instructions.   The patient was advised to call back or seek an in-person evaluation/go to the ED if the symptoms worsen or if the condition fails to improve as anticipated.  I provided *** minutes of face-to-face time during this encounter.   Reva Bores, MD Center for [employer], [facility]

## 2021-03-12 ENCOUNTER — Telehealth: Payer: Self-pay | Admitting: *Deleted

## 2021-03-12 ENCOUNTER — Encounter: Payer: Self-pay | Admitting: Family Medicine

## 2021-03-12 ENCOUNTER — Other Ambulatory Visit: Payer: Self-pay | Admitting: Family Medicine

## 2021-03-12 DIAGNOSIS — N9489 Other specified conditions associated with female genital organs and menstrual cycle: Secondary | ICD-10-CM

## 2021-03-12 NOTE — Assessment & Plan Note (Signed)
Given non-cyclical pain and presence for some time, patient would like to treat--will send referral to IR and have office schedule her an appointment. Noted on u/s and CT from 2019.

## 2021-03-12 NOTE — Telephone Encounter (Signed)
Patient sent MyChart message to find out when/how she will get appointment for referral to Interventional Radiology. Referral order is in for Pelvic Pain/ Pelvic Congestion syndrome. I called Interventional radiology [phone] - reached voicemail, I left message we are calling because we sent a referral and are calling to get appointment, I asked for a call back. Will message patient that we are trying to get appointment. Gokul Waybright,RN

## 2021-03-12 NOTE — Progress Notes (Signed)
GYNECOLOGY VIRTUAL VISIT ENCOUNTER NOTE  Provider location: [facility] for [facility]   Patient location: Home  I connected with Makayla Russell on 03/12/21 at 11:15 AM EDT by MyChart Video Encounter and verified that I am speaking with the correct person using two identifiers.   I discussed the limitations, risks, security and privacy concerns of performing an evaluation and management service virtually and the availability of in person appointments. I also discussed with the patient that there may be a patient responsible charge related to this service. The patient expressed understanding and agreed to proceed.   History:  Makayla Russell is a 35 y.o. [phone] female being evaluated today for f/u after u/s. Has h/o pelvic pain which has been on-going. Also reports new symptoms of vaginal dryness, poor mood and on-going vaginal bleeding since Depo in 12/2020. Desires a switch to patch. Previously on and tolerated well. She is not a smoker. She denies any abnormal vaginal discharge or other concerns.       Past Medical History:  Diagnosis Date   Abortion    x 2   Anemia    hx of   Asthma    uses inhaler PRN   Blood transfusion without reported diagnosis 2021   x 2    Clot    in ovary vein 2018   Depression    hx of   Dysplasia of cervix    colposcopy   GERD (gastroesophageal reflux disease)    uses OTC PRN   History of stomach ulcers    Past Surgical History:  Procedure Laterality Date   DILATION AND CURETTAGE OF UTERUS     INDUCED ABORTION     The following portions of the patient's history were reviewed and updated as appropriate: allergies, current medications, past family history, past medical history, past social history, past surgical history and problem list.   Health Maintenance:  Normal pap and negative HRHPV on 04/2018.   Review of Systems:  Pertinent items noted in HPI and remainder of comprehensive ROS otherwise negative.  Physical  Exam:   General:  Alert, oriented and cooperative. Patient appears to be in no acute distress.  Mental Status: Normal mood and affect. Normal behavior. Normal judgment and thought content.   Respiratory: Normal respiratory effort, no problems with respiration noted  Rest of physical exam deferred due to type of encounter  Labs and Imaging U/s 01/2021 IMPRESSION: 1. Small to moderate volume free fluid within the pelvis, likely physiologic. 2. Mild prominence of the pelvic vasculature within the adnexa bilaterally. Finding is nonspecific, but can be seen in the setting of pelvic congestion syndrome. 3. Otherwise unremarkable and normal pelvic ultrasound. No pelvic or adnexal mass.   Assessment and Plan:      Problem List Items Addressed This Visit       Unprioritized   Pelvic pain - Primary    Given non-cyclical pain and presence for some time, patient would like to treat--will send referral to IR and have office schedule her an appointment. Noted on u/s and CT from 2019.      Relevant Orders   Ambulatory referral to Interventional Radiology   Other Visit Diagnoses     Encounter for initial prescription of contraceptive pills       Switch from depo to Xulane--begin in mid-November.   Relevant Medications   norelgestromin-ethinyl estradiol Burr Medico) 150-35 MCG/24HR transdermal patch          I discussed the assessment and  treatment plan with the patient. The patient was provided an opportunity to ask questions and all were answered. The patient agreed with the plan and demonstrated an understanding of the instructions.   The patient was advised to call back or seek an in-person evaluation/go to the ED if the symptoms worsen or if the condition fails to improve as anticipated.  I provided 14 minutes of face-to-face time during this encounter.   Reva Bores, MD Center for [employer], [facility]

## 2021-03-19 ENCOUNTER — Ambulatory Visit
Admission: RE | Admit: 2021-03-19 | Discharge: 2021-03-19 | Disposition: A | Discharge status: Home / Self Care | Payer: Medicaid Other | Source: Ambulatory Visit | Attending: Family Medicine | Admitting: Family Medicine

## 2021-03-19 DIAGNOSIS — N9489 Other specified conditions associated with female genital organs and menstrual cycle: Secondary | ICD-10-CM

## 2021-03-19 HISTORY — PX: IR RADIOLOGIST EVAL & MGMT: IMG5224

## 2021-03-19 NOTE — Consult Note (Signed)
Chief Complaint: Patient was seen in consultation today for No chief complaint on file.  at the request of Pratt,Tanya S  Referring Physician(s): Pratt,Tanya S  History of Present Illness: Makayla Russell is a 35 y.o. G41P5 African-American female with a complaint of chronic left-sided pelvic pain for a few years which comes and goes and feels like "tightening and burning".  There has been no correlation with her menstrual cycle or particular activities.  She does have dyspareunia.  She does not notice anything that will improve her episodes of pain.  She has had a prior gastrointestinal work-up which has been negative.  By prior imaging, it was noted that there were some prominent pelvic veins by CT on 07/24/2017.  Pelvic ultrasound on 01/23/2021 demonstrated subjectively prominent distended veins in the adnexal/periuterine regions bilaterally.  Uterus and ovaries were unremarkable.  Prior menstrual cycles in the past were regular but then became irregular on Depo-Provera.  She previously had heavy menstrual bleeding and a history of prior low iron.  She has 5 children between the ages of 52 and 61 years old.  All of her children were born vaginally and were full-term.  She is potentially interested in having 1 more child.  Past Medical History:  Diagnosis Date   Abortion    x 2   Anemia    hx of   Asthma    uses inhaler PRN   Blood transfusion without reported diagnosis 2021   x 2    Clot    in ovary vein 2018   Depression    hx of   Dysplasia of cervix    colposcopy   GERD (gastroesophageal reflux disease)    uses OTC PRN   History of stomach ulcers     Past Surgical History:  Procedure Laterality Date   DILATION AND CURETTAGE OF UTERUS     INDUCED ABORTION      Allergies: Patient has no known allergies.  Medications: Prior to Admission medications   Medication Sig Start Date End Date Taking? Authorizing Provider  albuterol (ACCUNEB) 1.25 MG/3ML nebulizer solution  Take 1 ampule by nebulization every 6 (six) hours as needed for wheezing.    [provider]  norelgestromin-ethinyl estradiol [name]) 150-35 MCG/24HR transdermal patch Place 1 patch onto the skin once a week. 03/11/21   [name], MD     Family History  Problem Relation Age of Onset   Hypertension Mother    Tuberculosis Maternal Grandmother    Colon cancer Neg Hx    Colon polyps Neg Hx    Esophageal cancer Neg Hx    Rectal cancer Neg Hx    Stomach cancer Neg Hx     Social History   Socioeconomic History   Marital status: Single    Spouse name: Not on file   Number of children: 5   Years of education: Not on file   Highest education level: Not on file  Occupational History   Not on file  Tobacco Use   Smoking status: Never   Smokeless tobacco: Never  Vaping Use   Vaping Use: Never used  Substance and Sexual Activity   Alcohol use: Yes    Comment: 1-2 times per month   Drug use: No    Comment: past hx of marijuana use    Sexual activity: Yes    Birth control/protection: None  Other Topics Concern   Not on file  Social History Narrative   Not on  file   Social Determinants of Health   Financial Resource Strain: Not on file  Food Insecurity: No Food Insecurity   Worried About Charity fundraiser in the Last Year: Never true   Arboriculturist in the Last Year: Never true  Transportation Needs: Unmet Transportation Needs   Lack of Transportation (Medical): Yes   Lack of Transportation (Non-Medical): Yes  Physical Activity: Not on file  Stress: Not on file  Social Connections: Not on file    Review of Systems: A 12 point ROS discussed and pertinent positives are indicated in the HPI above.  All other systems are negative.  Review of Systems  Constitutional: Negative.   Respiratory: Negative.    Cardiovascular: Negative.   Gastrointestinal: Negative.   Genitourinary:  Positive for pelvic pain.  Musculoskeletal: Negative.   Neurological: Negative.     Vital Signs: BP 118/68 (BP Location: Left Arm)   Pulse 81   LMP  (LMP Unknown)   SpO2 99%   Physical Exam Vitals reviewed.  Constitutional:      General: She is not in acute distress.    Appearance: Normal appearance. She is not ill-appearing or toxic-appearing.  Neurological:     Mental Status: She is alert.    Imaging: No results found.  Labs:  CBC: No results for input(s): WBC, HGB, HCT, PLT in the last 8760 hours.  COAGS: No results for input(s): INR, APTT in the last 8760 hours.  BMP: No results for input(s): NA, K, CL, CO2, GLUCOSE, BUN, CALCIUM, CREATININE, GFRNONAA, GFRAA in the last 8760 hours.  Invalid input(s): CMP  LIVER FUNCTION TESTS: No results for input(s): BILITOT, AST, ALT, ALKPHOS, PROT, ALBUMIN in the last 8760 hours.   Assessment and Plan:  I met with Makayla Russell and reviewed her symptoms and prior imaging studies.  Her prior CT in 2019 does show some mildly prominent pelvic veins but no definitive enlargement of either ovarian vein.  The more recent pelvic ultrasound is more suggestive of dilated pelvic veins which may implicate pelvic venous insufficiency and potential incompetency of one or both ovarian veins.  Her symptoms appear to be strictly on the left side which may implicate insufficiency of the left ovarian vein.  In order to provide a more definitive indication to potentially intervene with catheter venography and possible venous embolization/occlusion, I recommended a repeat CT of the abdomen and pelvis with standard venous and delayed venous phases to look for progressive evidence of pelvic varicosities since 2019 and a dilated left ovarian vein.  I will meet back with Makayla Russell to discuss CT findings after the study is performed.  Thank you for this interesting consult.  I greatly enjoyed meeting Makayla Russell and look forward to participating in their care.  A copy of this report was sent to the requesting provider on this  date.  Electronically Signed: Azzie Roup 03/19/2021, 3:37 PM    I spent a total of 30 Minutes in face to face in clinical consultation, greater than 50% of which was counseling/coordinating care for pelvic pain.

## 2021-03-23 ENCOUNTER — Other Ambulatory Visit: Payer: Self-pay | Admitting: Interventional Radiology

## 2021-03-23 DIAGNOSIS — I862 Pelvic varices: Secondary | ICD-10-CM

## 2021-03-23 DIAGNOSIS — R102 Pelvic and perineal pain: Secondary | ICD-10-CM

## 2021-04-03 ENCOUNTER — Other Ambulatory Visit (HOSPITAL_COMMUNITY): Payer: Medicaid Other

## 2021-04-03 ENCOUNTER — Ambulatory Visit (HOSPITAL_COMMUNITY): Admission: RE | Admit: 2021-04-03 | Payer: Medicaid Other | Source: Ambulatory Visit

## 2021-04-06 ENCOUNTER — Other Ambulatory Visit: Payer: Self-pay

## 2021-04-06 ENCOUNTER — Ambulatory Visit (HOSPITAL_COMMUNITY)
Admission: RE | Admit: 2021-04-06 | Discharge: 2021-04-06 | Disposition: A | Discharge status: Home / Self Care | Payer: Medicaid Other | Source: Ambulatory Visit | Attending: Interventional Radiology | Admitting: Interventional Radiology

## 2021-04-06 ENCOUNTER — Encounter (HOSPITAL_COMMUNITY): Payer: Self-pay

## 2021-04-06 DIAGNOSIS — I862 Pelvic varices: Secondary | ICD-10-CM | POA: Diagnosis not present

## 2021-04-06 DIAGNOSIS — R102 Pelvic and perineal pain: Secondary | ICD-10-CM | POA: Insufficient documentation

## 2021-04-06 MED ORDER — SODIUM CHLORIDE (PF) 0.9 % IJ SOLN
INTRAMUSCULAR | Status: AC
  Filled 2021-04-06: qty 50

## 2021-04-06 MED ORDER — IOHEXOL 350 MG/ML SOLN
100.0000 mL | Freq: Once | INTRAVENOUS | Status: AC | PRN
  Administered 2021-04-06: 13:00:00 100 mL via INTRAVENOUS

## 2021-04-16 ENCOUNTER — Encounter: Payer: Self-pay | Admitting: *Deleted

## 2021-04-16 ENCOUNTER — Other Ambulatory Visit: Payer: Self-pay

## 2021-04-16 ENCOUNTER — Ambulatory Visit
Admission: RE | Admit: 2021-04-16 | Discharge: 2021-04-16 | Disposition: A | Discharge status: Home / Self Care | Payer: Medicaid Other | Source: Ambulatory Visit | Attending: Interventional Radiology | Admitting: Interventional Radiology

## 2021-04-16 DIAGNOSIS — I862 Pelvic varices: Secondary | ICD-10-CM

## 2021-04-16 DIAGNOSIS — R102 Pelvic and perineal pain: Secondary | ICD-10-CM

## 2021-04-16 HISTORY — PX: IR RADIOLOGIST EVAL & MGMT: IMG5224

## 2021-04-16 NOTE — Progress Notes (Signed)
Chief Complaint: Patient was consulted remotely today (TeleHealth) for follow-up of pelvic pain possibly secondary to pelvic venous insufficiency and pelvic congestion.  History of Present Illness: Makayla Russell is a 35 y.o. female previously seen in consultation on 03/19/2021 for pelvic pain potentially related to dilated pelvic veins and pelvic venous insufficiency.syndrome.  Symptoms are stable since consultation with primarily left-sided pelvic pain.  Past Medical History:  Diagnosis Date   Abortion    x 2   Anemia    hx of   Asthma    uses inhaler PRN   Blood transfusion without reported diagnosis 2021   x 2    Clot    in ovary vein 2018   Depression    hx of   Dysplasia of cervix    colposcopy   GERD (gastroesophageal reflux disease)    uses OTC PRN   History of stomach ulcers     Past Surgical History:  Procedure Laterality Date   DILATION AND CURETTAGE OF UTERUS     INDUCED ABORTION     IR RADIOLOGIST EVAL & MGMT  03/19/2021    Allergies: Patient has no known allergies.  Medications: Prior to Admission medications   Medication Sig Start Date End Date Taking? Authorizing Provider  albuterol (ACCUNEB) 1.25 MG/3ML nebulizer solution Take 1 ampule by nebulization every 6 (six) hours as needed for wheezing.    [provider]  norelgestromin-ethinyl estradiol Burr Medico) 150-35 MCG/24HR transdermal patch Place 1 patch onto the skin once a week. 03/11/21   Reva Bores, MD     Family History  Problem Relation Age of Onset   Hypertension Mother    Tuberculosis Maternal Grandmother    Colon cancer Neg Hx    Colon polyps Neg Hx    Esophageal cancer Neg Hx    Rectal cancer Neg Hx    Stomach cancer Neg Hx     Social History   Socioeconomic History   Marital status: Single    Spouse name: Not on file   Number of children: 5   Years of education: Not on file   Highest education level: Not on file  Occupational History   Not on file  Tobacco  Use   Smoking status: Never   Smokeless tobacco: Never  Vaping Use   Vaping Use: Never used  Substance and Sexual Activity   Alcohol use: Yes    Comment: 1-2 times per month   Drug use: No    Comment: past hx of marijuana use    Sexual activity: Yes    Birth control/protection: None  Other Topics Concern   Not on file  Social History Narrative   Not on file   Social Determinants of Health   Financial Resource Strain: Not on file  Food Insecurity: No Food Insecurity   Worried About Programme researcher, broadcasting/film/video in the Last Year: Never true   Ran Out of Food in the Last Year: Never true  Transportation Needs: Unmet Transportation Needs   Lack of Transportation (Medical): Yes   Lack of Transportation (Non-Medical): Yes  Physical Activity: Not on file  Stress: Not on file  Social Connections: Not on file    Review of Systems  Genitourinary:  Positive for pelvic pain.   Review of Systems: A 12 point ROS discussed and pertinent positives are indicated in the HPI above.  All other systems are negative.  Physical Exam No direct physical exam was performed (except for noted visual exam findings with Video  Visits).   Vital Signs: There were no vitals taken for this visit.  Imaging: CT VENOGRAM ABD/PEL  Result Date: 04/06/2021 CLINICAL DATA:  Chronic pelvic pain worse on the left, previous imaging findings concerning for pelvic congestion syndrome. EXAM: CTA ABDOMEN AND PELVIS WITH CONTRAST (venogram protocol) TECHNIQUE: Multidetector CT imaging of the abdomen and pelvis was performed using the standard protocol during bolus administration of intravenous contrast. Multiplanar reconstructed images and MIPs were obtained and reviewed to evaluate the vascular anatomy. Arterial and delayed portal venous phase imaging performed through the abdomen and pelvis as a CT venogram protocol. CONTRAST:  121mL OMNIPAQUE IOHEXOL 350 MG/ML SOLN COMPARISON:  07/24/2017 FINDINGS: VASCULAR Aorta: Normal  caliber aorta without aneurysm, dissection, vasculitis or significant stenosis. Celiac: Widely patent origin including its branches proximally. SMA: Widely patent origin including its branches in the central mesentery. Renals: Main renal arteries remain patent including an accessory left renal artery. IMA: Remains patent off the distal aorta including its branches. Inflow: Patent without evidence of aneurysm, dissection, vasculitis or significant stenosis. Proximal Outflow: Bilateral common femoral and visualized portions of the superficial and profunda femoral arteries are patent without evidence of aneurysm, dissection, vasculitis or significant stenosis. Veins: Delayed venous phase imaging performed as a CT venogram protocol. Hepatic, portal, splenic, mesenteric, and renal veins all appear patent. Nonspecific mildly dilated IVC without occlusion or thrombus. Pelvic iliac veins and visualized femoral veins are all patent. Ovarian veins appear prominent to mildly dilated bilaterally, worse on the right. There is a small amount of nonocclusive thrombus within the dilated right ovarian vein. There is slight increased interval distention or enlargement of the pelvic ovarian and uterine veins surrounding the uterus and cervix. 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 Review of the MIP images confirms the above findings. NON-VASCULAR Lower chest: No acute abnormality. Hepatobiliary: No focal liver abnormality is seen. No gallstones, gallbladder wall thickening, or biliary dilatation. Pancreas: Unremarkable. No pancreatic ductal dilatation or surrounding inflammatory changes. Spleen: Normal in size without focal abnormality. Adrenals/Urinary Tract: Adrenal glands are unremarkable. Kidneys are normal, without renal calculi, focal lesion, or hydronephrosis. Bladder is unremarkable. Stomach/Bowel: Stomach is within normal limits. Appendix appears normal. No evidence of bowel wall thickening,  distention, or inflammatory changes. Lymphatic: No adenopathy Reproductive: Retroverted uterus, normal in size. Similar nonspecific heterogeneous uterine enhancement. Small amount of pelvic free fluid, suspect physiologic. No fluid collection or hematoma. Other: No abdominal wall hernia or abnormality. No abdominopelvic ascites. Musculoskeletal: No acute or significant osseous findings. IMPRESSION: VASCULAR Enlarged dilated right ovarian vein and mildly dilated left ovarian vein. 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 Associated increased distension/enlargement of the pelvic ovarian and uterine venous complex bilaterally compatible with pelvic congestion syndrome. This does appear slightly worse compared to 2019 exam. NON-VASCULAR No other acute intra-abdominal or pelvic finding Electronically Signed   By: Jerilynn Mages.  Shick M.D.   On: 04/06/2021 13:56   IR Radiologist Eval & Mgmt  Result Date: 03/19/2021 Please refer to notes tab for details about interventional procedure. (Op Note)    Assessment and Plan:  I spoke with Ms. Siegle over the phone to review results from a CT venogram of the abdomen and pelvis performed on 04/06/2021.  This demonstrates a dilated right ovarian vein with nonocclusive thrombus present in 2 separate trunks in the lower abdomen and pelvis.  This is consistent with slow/stagnant flow and insufficiency of the right ovarian vein.  The left ovarian vein is not significantly enlarged and appears stable in size compared to  a prior CT on 07/24/2017.  Prior CT in 2019 does not demonstrate thrombus in the right ovarian vein but the right ovarian vein was dilated on that study.    Ms. Youngblood reports that she does have a history of prior thrombophlebitis of the right ovarian vein in 2018 before she moved to this region.  At that time she was placed on a short course of anticoagulation.  Given current CT appearance of a relatively small amount of nonocclusive thrombus in the right ovarian  vein, risk of a thromboembolic event would be very low.  I discussed possible anticoagulation with Ms. Robarge and also aspirin to reduce the risk of thrombus propagation.  She would like to try aspirin and I recommended a full 325 mg aspirin daily.  I will follow-up with her in 3 months to see how she is doing.  Ultimately, a follow-up CT venogram will be helpful to reevaluate the right ovarian vein in roughly 6 months.  There is no role for embolization at this time given the presence of nonocclusive thrombus and lack of significant dilatation of the left ovarian vein.   Electronically Signed: Azzie Roup 04/16/2021, 9:10 AM    I spent a total of  10 Minutes in remote  clinical consultation, greater than 50% of which was counseling/coordinating care for pelvic venous insufficiency.    Visit type: Audio only (telephone). Audio (no video) only due to patient's lack of internet/smartphone capability. Alternative for in-person consultation at [facility], [address] [place], [place], [place]. This visit type was conducted due to national recommendations for restrictions regarding the COVID-19 Pandemic (e.g. social distancing).  This format is felt to be most appropriate for this patient at this time.  All issues noted in this document were discussed and addressed.

## 2021-07-01 ENCOUNTER — Other Ambulatory Visit: Payer: Self-pay | Admitting: Interventional Radiology

## 2021-07-01 DIAGNOSIS — I862 Pelvic varices: Secondary | ICD-10-CM

## 2021-07-01 DIAGNOSIS — R102 Pelvic and perineal pain: Secondary | ICD-10-CM

## 2021-08-14 ENCOUNTER — Ambulatory Visit
Admission: RE | Admit: 2021-08-14 | Discharge: 2021-08-14 | Disposition: A | Discharge status: Home / Self Care | Payer: Medicaid Other | Source: Ambulatory Visit | Attending: Interventional Radiology | Admitting: Interventional Radiology

## 2021-08-14 ENCOUNTER — Encounter: Payer: Self-pay | Admitting: *Deleted

## 2021-08-14 DIAGNOSIS — I862 Pelvic varices: Secondary | ICD-10-CM

## 2021-08-14 DIAGNOSIS — R102 Pelvic and perineal pain: Secondary | ICD-10-CM

## 2021-08-14 HISTORY — PX: IR RADIOLOGIST EVAL & MGMT: IMG5224

## 2021-08-14 NOTE — Progress Notes (Signed)
? ? ?Chief Complaint: ?Patient was consulted remotely today (TeleHealth) for follow-up of left-sided pelvic pain. ? ?History of Present Illness: ?Makayla Russell is a 36 y.o. female seen previously on 03/19/2021 for chronic left-sided pelvic pain.  Previous CT and ultrasound demonstrated prominent pelvic veins.  A CT venogram was performed on 04/06/2021 demonstrating a dilated right ovarian vein with nonocclusive thrombus in 2 separate trunks in the lower abdomen and pelvis.  The left ovarian vein was not significantly enlarged.  There was no role for embolization at that time given the presence of nonocclusive thrombus and lack of significant dilatation of the left ovarian vein.  Aspirin therapy was recommended.  The patient has only been intermittently taking aspirin.  She states that her left-sided pelvic pain symptoms have been persistent and have not resolved.  Symptoms are stable and not worse. ? ?Past Medical History:  ?Diagnosis Date  ? Abortion   ? x 2  ? Anemia   ? hx of  ? Asthma   ? uses inhaler PRN  ? Blood transfusion without reported diagnosis 2021  ? x 2   ? Clot   ? in ovary vein 2018  ? Depression   ? hx of  ? Dysplasia of cervix   ? colposcopy  ? GERD (gastroesophageal reflux disease)   ? uses OTC PRN  ? History of stomach ulcers   ? ? ?Past Surgical History:  ?Procedure Laterality Date  ? DILATION AND CURETTAGE OF UTERUS    ? INDUCED ABORTION    ? IR RADIOLOGIST EVAL & MGMT  03/19/2021  ? IR RADIOLOGIST EVAL & MGMT  04/16/2021  ? IR RADIOLOGIST EVAL & MGMT  08/14/2021  ? ? ?Allergies: ?Patient has no known allergies. ? ?Medications: ?Prior to Admission medications   ?Medication Sig Start Date End Date Taking? Authorizing Provider  ?albuterol (ACCUNEB) 1.25 MG/3ML nebulizer solution Take 1 ampule by nebulization every 6 (six) hours as needed for wheezing.    [provider]  ?norelgestromin-ethinyl estradiol [employer]) 150-35 MCG/24HR transdermal patch Place 1 patch onto the skin once a week.  03/11/21   Reva Bores, MD  ?  ? ?Family History  ?Problem Relation Age of Onset  ? Hypertension Mother   ? Tuberculosis Maternal Grandmother   ? Colon cancer Neg Hx   ? Colon polyps Neg Hx   ? Esophageal cancer Neg Hx   ? Rectal cancer Neg Hx   ? Stomach cancer Neg Hx   ? ? ?Social History  ? ?Socioeconomic History  ? Marital status: Single  ?  Spouse name: Not on file  ? Number of children: 5  ? Years of education: Not on file  ? Highest education level: Not on file  ?Occupational History  ? Not on file  ?Tobacco Use  ? Smoking status: Never  ? Smokeless tobacco: Never  ?Vaping Use  ? Vaping Use: Never used  ?Substance and Sexual Activity  ? Alcohol use: Yes  ?  Comment: 1-2 times per month  ? Drug use: No  ?  Comment: past hx of marijuana use   ? Sexual activity: Yes  ?  Birth control/protection: None  ?Other Topics Concern  ? Not on file  ?Social History Narrative  ? Not on file  ? ?Social Determinants of Health  ? ?Financial Resource Strain: Not on file  ?Food Insecurity: No Food Insecurity  ? Worried About Programme researcher, broadcasting/film/video in the Last Year: Never true  ? Ran Out of Food in  the Last Year: Never true  ?Transportation Needs: Not on file  ?Physical Activity: Not on file  ?Stress: Not on file  ?Social Connections: Not on file  ? ? ?Review of Systems  ?Respiratory: Negative.    ?Cardiovascular: Negative.   ?Gastrointestinal: Negative.   ?Genitourinary:  Positive for pelvic pain.  ?Musculoskeletal: Negative.   ?Neurological: Negative.   ? ?Review of Systems: A 12 point ROS discussed and pertinent positives are indicated in the HPI above.  All other systems are negative. ? ?Physical Exam ?No direct physical exam was performed (except for noted visual exam findings with Video Visits).  ? ?Vital Signs: ?There were no vitals taken for this visit. ? ?Imaging: ?IR Radiologist Eval & Mgmt ? ?Result Date: 08/14/2021 ?Please refer to notes tab for details about interventional procedure. (Op Note)  ? ? ?Assessment and  Plan: ? ?I spoke with Ms. Callow over the phone.  I did recommend that she continue to try to take at least a low-dose aspirin daily.  I recommended a follow-up CT venogram in late May, approximately 6 months after the previous study to determine whether the right ovarian vein thrombophlebitis resolves and assess caliber of the left ovarian vein and pelvic varicosities. ? ?Electronically Signed: ?Reola Calkins ?08/14/2021, 3:37 PM ? ? ? 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   ? ?Visit type: Audio only (telephone). Audio (no video) only due to patient's lack of internet/smartphone capability. ?Alternative for in-person consultation at [facility], [address] [place], [place], [place]. ?This visit type was conducted due to national recommendations for restrictions regarding the COVID-19 Pandemic (e.g. social distancing).  This format is felt to be most appropriate for this patient at this time.  All issues noted in this document were discussed and addressed.    ? ?

## 2021-08-21 ENCOUNTER — Ambulatory Visit: Payer: Medicaid Other | Admitting: Family Medicine

## 2021-08-24 ENCOUNTER — Other Ambulatory Visit: Payer: Self-pay | Admitting: Interventional Radiology

## 2021-08-24 ENCOUNTER — Other Ambulatory Visit: Payer: Self-pay | Admitting: Family Medicine

## 2021-08-24 DIAGNOSIS — I862 Pelvic varices: Secondary | ICD-10-CM

## 2021-08-24 DIAGNOSIS — R102 Pelvic and perineal pain: Secondary | ICD-10-CM

## 2021-09-15 DIAGNOSIS — J4520 Mild intermittent asthma, uncomplicated: Secondary | ICD-10-CM | POA: Insufficient documentation

## 2021-09-23 ENCOUNTER — Other Ambulatory Visit: Payer: Self-pay | Admitting: Interventional Radiology

## 2021-09-23 ENCOUNTER — Other Ambulatory Visit (HOSPITAL_COMMUNITY): Payer: Self-pay | Admitting: Interventional Radiology

## 2021-09-23 DIAGNOSIS — I862 Pelvic varices: Secondary | ICD-10-CM

## 2021-10-06 ENCOUNTER — Ambulatory Visit (HOSPITAL_COMMUNITY): Admission: RE | Admit: 2021-10-06 | Payer: Medicaid Other | Source: Ambulatory Visit

## 2021-10-07 DIAGNOSIS — E782 Mixed hyperlipidemia: Secondary | ICD-10-CM | POA: Insufficient documentation

## 2021-10-16 ENCOUNTER — Ambulatory Visit (HOSPITAL_COMMUNITY)
Admission: RE | Admit: 2021-10-16 | Discharge: 2021-10-16 | Disposition: A | Discharge status: Home / Self Care | Payer: Medicaid Other | Source: Ambulatory Visit | Attending: Interventional Radiology | Admitting: Interventional Radiology

## 2021-10-16 DIAGNOSIS — I862 Pelvic varices: Secondary | ICD-10-CM | POA: Insufficient documentation

## 2021-10-16 MED ORDER — IOHEXOL 350 MG/ML SOLN
100.0000 mL | Freq: Once | INTRAVENOUS | Status: AC | PRN
  Administered 2021-10-16: 100 mL via INTRAVENOUS

## 2021-10-27 ENCOUNTER — Encounter: Payer: Self-pay | Admitting: *Deleted

## 2021-10-27 ENCOUNTER — Ambulatory Visit
Admission: RE | Admit: 2021-10-27 | Discharge: 2021-10-27 | Disposition: A | Discharge status: Home / Self Care | Payer: Medicaid Other | Source: Ambulatory Visit | Attending: Interventional Radiology | Admitting: Interventional Radiology

## 2021-10-27 DIAGNOSIS — I862 Pelvic varices: Secondary | ICD-10-CM

## 2021-10-27 DIAGNOSIS — R102 Pelvic and perineal pain: Secondary | ICD-10-CM

## 2021-10-27 HISTORY — PX: IR RADIOLOGIST EVAL & MGMT: IMG5224

## 2021-10-27 NOTE — Progress Notes (Signed)
Chief Complaint: Patient was consulted remotely today (TeleHealth) for follow-up of pelvic pain and possible pelvic congestion syndrome.  History of Present Illness: Makayla Russell is a 36 y.o. female seen previously for chronic left-sided pelvic pain. Previous CT and ultrasound demonstrated prominent pelvic veins.  A CT venogram was performed on 04/06/2021 demonstrating a dilated right ovarian vein with nonocclusive thrombus in 2 separate trunks in the lower abdomen and pelvis.  The left ovarian vein was not significantly enlarged.  There was no role for embolization at that time given the presence of nonocclusive thrombus and lack of significant dilatation of the left ovarian vein.  Aspirin therapy was recommended.    She states that her left-sided pelvic pain symptoms have been persistent and have not resolved.  Symptoms are stable and not worse.  A follow-up CT venogram of the abdomen and pelvis was performed on 10/16/2021.   Past Medical History:  Diagnosis Date   Abortion    x 2   Anemia    hx of   Asthma    uses inhaler PRN   Blood transfusion without reported diagnosis 2021   x 2    Clot    in ovary vein 2018   Depression    hx of   Dysplasia of cervix    colposcopy   GERD (gastroesophageal reflux disease)    uses OTC PRN   History of stomach ulcers     Past Surgical History:  Procedure Laterality Date   DILATION AND CURETTAGE OF UTERUS     INDUCED ABORTION     IR RADIOLOGIST EVAL & MGMT  03/19/2021   IR RADIOLOGIST EVAL & MGMT  04/16/2021   IR RADIOLOGIST EVAL & MGMT  08/14/2021    Allergies: Patient has no known allergies.  Medications: Prior to Admission medications   Medication Sig Start Date End Date Taking? Authorizing Provider  albuterol (ACCUNEB) 1.25 MG/3ML nebulizer solution Take 1 ampule by nebulization every 6 (six) hours as needed for wheezing.    [provider]  norelgestromin-ethinyl estradiol [employer]ico) 150-35 MCG/24HR transdermal patch  Place 1 patch onto the skin once a week. 03/11/21   Reva Bores, MD     Family History  Problem Relation Age of Onset   Hypertension Mother    Tuberculosis Maternal Grandmother    Colon cancer Neg Hx    Colon polyps Neg Hx    Esophageal cancer Neg Hx    Rectal cancer Neg Hx    Stomach cancer Neg Hx     Social History   Socioeconomic History   Marital status: Single    Spouse name: Not on file   Number of children: 5   Years of education: Not on file   Highest education level: Not on file  Occupational History   Not on file  Tobacco Use   Smoking status: Never   Smokeless tobacco: Never  Vaping Use   Vaping Use: Never used  Substance and Sexual Activity   Alcohol use: Yes    Comment: 1-2 times per month   Drug use: No    Comment: past hx of marijuana use    Sexual activity: Yes    Birth control/protection: None  Other Topics Concern   Not on file  Social History Narrative   Not on file   Social Determinants of Health   Financial Resource Strain: Not on file  Food Insecurity: No Food Insecurity (12/24/2020)   Hunger Vital Sign    Worried About Running  Out of Food in the Last Year: Never true    Ran Out of Food in the Last Year: Never true  Transportation Needs: Unmet Transportation Needs (05/13/2020)   PRAPARE - Administrator, Civil Service (Medical): Yes    Lack of Transportation (Non-Medical): Yes  Physical Activity: Not on file  Stress: Not on file  Social Connections: Not on file     Review of Systems  Constitutional: Negative.   Respiratory: Negative.    Cardiovascular: Negative.   Gastrointestinal: Negative.   Genitourinary:  Positive for dyspareunia and pelvic pain. Negative for dysuria, hematuria, menstrual problem, vaginal bleeding and vaginal discharge.  Musculoskeletal: Negative.   Neurological: Negative.     Review of Systems: A 12 point ROS discussed and pertinent positives are indicated in the HPI above.  All other systems are  negative.   Physical Exam No direct physical exam was performed (except for noted visual exam findings with Video Visits).   Vital Signs: There were no vitals taken for this visit.  Imaging: CT VENOGRAM ABD/PEL  Result Date: 10/16/2021 CLINICAL DATA:  Pelvic varices, follow-up pelvic venous insufficiency, left lower quadrant pain EXAM: CT VENOGRAM ABDOMEN AND PELVIS TECHNIQUE: RADIATION DOSE REDUCTION: This exam was performed according to the departmental dose-optimization program which includes automated exposure control, adjustment of the mA and/or kV according to patient size and/or use of iterative reconstruction technique. CONTRAST:  OMNIPAQUE IOHEXOL 350 MG/ML SOLN COMPARISON:  04/06/2021 and previous FINDINGS: VASCULAR Arterial phase unremarkable. Abdominal aorta and branch vessels unremarkable. Incomplete venous opacification on delayed venous scan. Some residual incompletely occlusive thrombus in dilated right ovarian vein. Incomplete opacification of the parametrial venous plexus. Incomplete opacification iliac venous system and IVC. Patent bilateral renal veins. Patent hepatic veins, portal vein, SMV, and splenic vein. NON-VASCULAR Lower chest: No acute abnormality. Hepatobiliary: No focal liver abnormality is seen. No gallstones, gallbladder wall thickening, or biliary dilatation. Pancreas: Unremarkable. No pancreatic ductal dilatation or surrounding inflammatory changes. Spleen: Normal in size without focal abnormality. Adrenals/Urinary Tract: Adrenal glands are unremarkable. Kidneys are normal, without renal calculi, focal lesion, or hydronephrosis. Bladder is unremarkable. Stomach/Bowel: Stomach and small bowel are nondilated, unremarkable. Appendix not discretely identified. Colon is incompletely distended, unremarkable. Lymphatic: No abdominal or pelvic adenopathy. Reproductive: Heterogenous uterine enhancement probably related to scan timing. No adnexal mass. Other: Small volume  pelvic fluid which can be physiologic in menstruating females. No abdominal ascites. No free air. Musculoskeletal: No acute or significant osseous findings. IMPRESSION: 1. No acute findings. 2. 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 Electronically Signed   By: Corlis Leak M.D.   On: 10/16/2021 10:12     Assessment and Plan:  I spoke with Makayla Russell over the phone and reviewed the recent CT venogram findings with her.  Based on my review, there still remains some chronic nonocclusive thrombus in the right ovarian vein but the vein is less distended compared to the prior CT.  The left ovarian vein remains nondilated and pelvic veins are not very distended on the current study.  Patchy irregular enhancement of the uterus is persistent on standard venous and delayed venous phases and appears more striking compared to the prior CT venogram.  I told Makayla Russell that currently there does not appear to be any indication for gonadal venography or embolization given some improvement in dilatation of the right ovarian vein with persistent chronic nonocclusive thrombus, lack of dilatation of the left ovarian vein and lack of significant  dilated pelvic veins by CT.  If anything, the pelvic veins appear less distended than on the prior CT.  The irregularity of uterine enhancement is more impressive on the current study and this does raise the possibility of underlying adenomyosis.  Adenomyosis could be a cause for chronic pelvic pain and I recommended that we consider pelvic MRI imaging to determine if there are any imaging findings to suggest significant adenomyosis.  I told Makayla Russell that I will forward this to Dr. Shawnie Pons and either myself or Dr. Shawnie Pons will proceed with ordering a pelvic MRI with and without contrast.   Electronically Signed: Reola Calkins 10/27/2021, 10:40 AM    I spent a total of  10 Minutes in remote  clinical consultation, greater than 50% of which was  counseling/coordinating care for chronic pelvic pain.    Visit type: Audio only (telephone). Audio (no video) only due to patient's lack of internet/smartphone capability. Alternative for in-person consultation at [facility], [address] [place], [place], [place]. This visit type was conducted due to national recommendations for restrictions regarding the COVID-19 Pandemic (e.g. social distancing).  This format is felt to be most appropriate for this patient at this time.  All issues noted in this document were discussed and addressed.

## 2021-11-28 ENCOUNTER — Ambulatory Visit
Admission: EM | Admit: 2021-11-28 | Discharge: 2021-11-28 | Disposition: A | Discharge status: Home / Self Care | Payer: Medicaid Other | Attending: Urgent Care | Admitting: Urgent Care

## 2021-11-28 DIAGNOSIS — Z9289 Personal history of other medical treatment: Secondary | ICD-10-CM

## 2021-11-28 DIAGNOSIS — R102 Pelvic and perineal pain: Secondary | ICD-10-CM | POA: Diagnosis not present

## 2021-11-28 DIAGNOSIS — K1370 Unspecified lesions of oral mucosa: Secondary | ICD-10-CM

## 2021-11-28 DIAGNOSIS — M542 Cervicalgia: Secondary | ICD-10-CM | POA: Insufficient documentation

## 2021-11-28 DIAGNOSIS — E611 Iron deficiency: Secondary | ICD-10-CM | POA: Insufficient documentation

## 2021-11-28 NOTE — ED Notes (Addendum)
Patient needs blood work done for STD's. Was seen yesterday at [facility] and they did swab and patient was suppose to go to lab for blood work but they were closed. Recently had a blood transfusion so requesting Hepatitis along with Syphilis and HIV   Patient states she has lesion in her mouth    Abdominal pain for several years after D&C 

## 2021-11-28 NOTE — Discharge Instructions (Addendum)
[facility] °Emergency dental service in [place], [place] °Address: [address], [place], [place] [address] °Phone: [phone] ° °[facility] °[phone] extension [phone] °[address] ° °[place] °[phone] °[address] ° °[employer] °[phone] °[address] ° °[facility] °[phone] extension [phone] °[address] [place], [place], [address] °First come first serve for the first 10 clients.  May do simple extractions only, no wisdom teeth or surgery.  You may try the second for Thursday of the month starting at 6:30 AM. ° °[employer] °You may call the school to see if they are still helping to provide dental care for emergent cases. ° °

## 2021-11-28 NOTE — ED Provider Notes (Signed)
[facility]   MRN: [id number] DOB: 1985/07/22  Subjective:   Makayla Russell is a 36 y.o. female presenting for an evaluation of an oral lesion.  She would also like blood test to be done for HIV, syphilis, hepatitis.  She had a recent blood transfusion and was advised to get these blood test done.  Has a longstanding history of pelvic pain that is being worked up by another one of her providers.  Orders were placed to have blood work done at a laboratory but unfortunately patient was not able to get that done and therefore would like to get that through [place].  Regarding her oral lesion, she has noticed it for about a year.  There is no pain, drainage, bleeding, fevers.  No current facility-administered medications for this encounter.  Current Outpatient Medications:    albuterol (ACCUNEB) 1.25 MG/3ML nebulizer solution, Take 1 ampule by nebulization every 6 (six) hours as needed for wheezing., Disp: , Rfl:    norelgestromin-ethinyl estradiol [employer]ico) 150-35 MCG/24HR transdermal patch, Place 1 patch onto the skin once a week., Disp: 9 patch, Rfl: 3   No Known Allergies  Past Medical History:  Diagnosis Date   Abortion    x 2   Anemia    hx of   Asthma    uses inhaler PRN   Blood transfusion without reported diagnosis 2021   x 2    Clot    in ovary vein 2018   Depression    hx of   Dysplasia of cervix    colposcopy   GERD (gastroesophageal reflux disease)    uses OTC PRN   History of stomach ulcers      Past Surgical History:  Procedure Laterality Date   DILATION AND CURETTAGE OF UTERUS     INDUCED ABORTION     IR RADIOLOGIST EVAL & MGMT  03/19/2021   IR RADIOLOGIST EVAL & MGMT  04/16/2021   IR RADIOLOGIST EVAL & MGMT  08/14/2021   IR RADIOLOGIST EVAL & MGMT  10/27/2021    Family History  Problem Relation Age of Onset   Hypertension Mother    Tuberculosis Maternal Grandmother    Colon cancer Neg Hx    Colon polyps Neg Hx    Esophageal cancer Neg  Hx    Rectal cancer Neg Hx    Stomach cancer Neg Hx     Social History   Tobacco Use   Smoking status: Never   Smokeless tobacco: Never  Vaping Use   Vaping Use: Never used  Substance Use Topics   Alcohol use: Yes    Comment: 1-2 times per month   Drug use: No    Comment: past hx of marijuana use     ROS   Objective:   Vitals: BP 116/73 (BP Location: Right Arm)   Pulse 77   Temp 98.4 F (36.9 C) (Oral)   SpO2 100%   Physical Exam Constitutional:      General: She is not in acute distress.    Appearance: Normal appearance. She is well-developed. She is not ill-appearing, toxic-appearing or diaphoretic.  HENT:     Head: Normocephalic and atraumatic.     Nose: Nose normal.     Mouth/Throat:     Mouth: Mucous membranes are moist.   Eyes:     General: No scleral icterus.       Right eye: No discharge.        Left eye: No discharge.  Extraocular Movements: Extraocular movements intact.  Cardiovascular:     Rate and Rhythm: Normal rate.  Pulmonary:     Effort: Pulmonary effort is normal.  Skin:    General: Skin is warm and dry.  Neurological:     General: No focal deficit present.     Mental Status: She is alert and oriented to person, place, and time.  Psychiatric:        Mood and Affect: Mood normal.        Behavior: Behavior normal.     Assessment and Plan :   PDMP not reviewed this encounter.  1. Pelvic pain   2. History of recent blood transfusion   3. Oral lesion    Patient is to maintain follow-up with her regular doctor regarding work-up of her chronic pelvic pain.  Blood test pending. Suspect sialolithiasis and recommended using sour candies orally to get her to express.  Also recommended a consultation with an oral specialist.  Provided her with information for this. Counseled patient on potential for adverse effects with medications prescribed/recommended today, ER and return-to-clinic precautions discussed, patient verbalized  understanding.    Wallis Bamberg, [place] 11/28/21 1325

## 2021-11-28 NOTE — ED Triage Notes (Signed)
Patient needs blood work done for [employer]. Was seen yesterday at [facility] and they did swab and patient was suppose to go to lab for blood work but they were closed. Recently had a blood transfusion so requesting Hepatitis along with Syphilis and HIV   Patient states she has lesion in her mouth    Abdominal pain for several years after D&C

## 2021-11-29 LAB — HEPATITIS C ANTIBODY: Hep C Virus Ab: NONREACTIVE

## 2021-11-29 LAB — RPR: RPR Ser Ql: NONREACTIVE

## 2021-11-29 LAB — HEPATITIS B SURFACE ANTIGEN: Hepatitis B Surface Ag: NEGATIVE

## 2021-11-29 LAB — HIV ANTIBODY (ROUTINE TESTING W REFLEX): HIV Screen 4th Generation wRfx: NONREACTIVE

## 2021-12-08 ENCOUNTER — Emergency Department (HOSPITAL_COMMUNITY)
Admission: EM | Admit: 2021-12-08 | Discharge: 2021-12-09 | Disposition: A | Discharge status: Home / Self Care | Payer: Medicaid Other | Attending: Emergency Medicine | Admitting: Emergency Medicine

## 2021-12-08 ENCOUNTER — Other Ambulatory Visit: Payer: Self-pay

## 2021-12-08 DIAGNOSIS — M542 Cervicalgia: Secondary | ICD-10-CM | POA: Diagnosis not present

## 2021-12-08 DIAGNOSIS — K1370 Unspecified lesions of oral mucosa: Secondary | ICD-10-CM | POA: Diagnosis not present

## 2021-12-08 NOTE — ED Triage Notes (Signed)
Pt here for lymph node swelling in neck associated w/ headaches. Pt states she feels like her neck is swollen, denies sore throat. Pt  reports she has had this as an ongoing problem since have D&C in 2019. Pt states today she noticed a discoloration under her tongue and became worried.

## 2021-12-09 ENCOUNTER — Encounter (HOSPITAL_COMMUNITY): Payer: Self-pay | Admitting: Emergency Medicine

## 2021-12-09 NOTE — ED Provider Notes (Signed)
[facility] EMERGENCY DEPARTMENT  Provider Note  CSN: [id number] Arrival date & time: 12/08/21 2333  History Chief Complaint  Patient presents with   Neck Pain    Makayla Russell is a 36 y.o. female reports she's been having throat and neck problems since having a blood transfusion as part of a D&C procedure in 2019. In recent weeks she has become concerned about lesions she sees on her tongue and swollen lymph nodes in her neck. She has been referred to ENT but appointment is not for several weeks. [facility] of her chronic [facility] pain and mentioned the tongue lesion. They sent labs for blood borne pathogens, but all were negative. She denies any fever or difficulty swallowing.    Home Medications Prior to Admission medications   Medication Sig Start Date End Date Taking? Authorizing Provider  albuterol (ACCUNEB) 1.25 MG/3ML nebulizer solution Take 1 ampule by nebulization every 6 (six) hours as needed for wheezing.    [provider]  norelgestromin-ethinyl estradiol [employer]ico) 150-35 MCG/24HR transdermal patch Place 1 patch onto the skin once a week. 03/11/21   Reva Bores, MD     Allergies    Patient has no known allergies.   Review of Systems   Review of Systems Please see HPI for pertinent positives and negatives  Physical Exam BP 132/72   Pulse 64   Temp 98.5 F (36.9 C) (Oral)   Resp 17   Ht 5\' 6"  (1.676 m)   Wt 59.9 kg   LMP 12/03/2021 (Approximate)   SpO2 100%   BMI 21.31 kg/m   Physical Exam Vitals and nursing note reviewed.  Constitutional:      Appearance: Normal appearance.  HENT:     Head: Normocephalic and atraumatic.     Nose: Nose normal.     Mouth/Throat:     Mouth: Mucous membranes are moist.     Comments: One small 2-61mm soft fleshy lesion on the left side of her sublingual frenulum, no other masses or concerning lesions in the areas she is pointing out Eyes:     Extraocular Movements:  Extraocular movements intact.     Conjunctiva/sclera: Conjunctivae normal.  Cardiovascular:     Rate and Rhythm: Normal rate.  Pulmonary:     Effort: Pulmonary effort is normal.     Breath sounds: Normal breath sounds.  Abdominal:     General: Abdomen is flat.     Palpations: Abdomen is soft.     Tenderness: There is no abdominal tenderness.  Musculoskeletal:        General: No swelling. Normal range of motion.     Cervical back: Neck supple.  Lymphadenopathy:     Cervical: No cervical adenopathy (no concerning or palpable lymph nodes).  Skin:    General: Skin is warm and dry.  Neurological:     General: No focal deficit present.     Mental Status: She is alert.  Psychiatric:        Mood and Affect: Mood normal.     ED Results / Procedures / Treatments   EKG None  Procedures Procedures  Medications Ordered in the ED Medications - No data to display  Initial Impression and Plan  Patient here with concerns of oral lesions and lymphadenopathy but no concerning findings on exam today. No indication for additional ED workup. She was upset that labs and/or imaging was not being done however I explained to her that ENT  will need to see her to direct her outpatient workup. She has no evidence of emergent medical condition. Stable for discharge.   ED Course       MDM Rules/Calculators/A&P Medical Decision Making Problems Addressed: Oral lesion: chronic illness or injury    Final Clinical Impression(s) / ED Diagnoses Final diagnoses:  Oral lesion    Rx / DC Orders ED Discharge Orders     None        Pollyann Savoy, MD 12/09/21 [phone]

## 2021-12-09 NOTE — ED Notes (Signed)
Pt upset about discharge, attempted to discuss DC instructions with pt, opportunity not given. MD explained need for follow up with ENT while this RN was at bedside. Pt ambulatory to ED waiting room with steady gait.

## 2021-12-30 DIAGNOSIS — K1379 Other lesions of oral mucosa: Secondary | ICD-10-CM | POA: Insufficient documentation

## 2021-12-31 ENCOUNTER — Ambulatory Visit
Admission: EM | Admit: 2021-12-31 | Discharge: 2021-12-31 | Disposition: A | Discharge status: Home / Self Care | Payer: Medicaid Other | Attending: Urgent Care | Admitting: Urgent Care

## 2021-12-31 ENCOUNTER — Encounter: Payer: Self-pay | Admitting: Emergency Medicine

## 2021-12-31 DIAGNOSIS — R42 Dizziness and giddiness: Secondary | ICD-10-CM | POA: Insufficient documentation

## 2021-12-31 DIAGNOSIS — R5383 Other fatigue: Secondary | ICD-10-CM | POA: Diagnosis present

## 2021-12-31 DIAGNOSIS — Z20822 Contact with and (suspected) exposure to covid-19: Secondary | ICD-10-CM | POA: Insufficient documentation

## 2021-12-31 DIAGNOSIS — R110 Nausea: Secondary | ICD-10-CM | POA: Insufficient documentation

## 2021-12-31 LAB — POCT FASTING CBG KUC MANUAL ENTRY: POCT Glucose (KUC): 71 mg/dL (ref 70–99)

## 2021-12-31 LAB — POCT URINALYSIS DIP (MANUAL ENTRY)
Bilirubin, UA: NEGATIVE
Glucose, UA: NEGATIVE mg/dL
Ketones, POC UA: NEGATIVE mg/dL
Leukocytes, UA: NEGATIVE
Nitrite, UA: NEGATIVE
Protein Ur, POC: NEGATIVE mg/dL
Spec Grav, UA: 1.025 (ref 1.010–1.025)
Urobilinogen, UA: 0.2 E.U./dL
pH, UA: 5 (ref 5.0–8.0)

## 2021-12-31 MED ORDER — ONDANSETRON 8 MG PO TBDP: 8.0000 mg | ORAL_TABLET | Freq: Three times a day (TID) | ORAL | 0 refills | Status: DC | PRN

## 2021-12-31 NOTE — ED Provider Notes (Signed)
[facility]   MRN: [id number] DOB: [birth year]  Subjective:   Makayla Russell is a 36 y.o. female presenting for 2 to 3-day history of acute onset persistent fatigue, malaise, nausea without vomiting, intermittent dizziness and decreased appetite. Patient is currently on her cycle, does not want a pregnancy test.  No throat pain, chest pain, shortness of breath, coughing, abdominal pelvic pain, urinary symptoms.  Patient has history of anemia and is on iron supplementation now.  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  Takes iron supplementation, atorvastatin.    No Known Allergies  Past Medical History:  Diagnosis Date   Abortion    x 2   Anemia    hx of   Asthma    uses inhaler PRN   Blood transfusion without reported diagnosis 2021   x 2    Clot    in ovary vein 2018   Depression    hx of   Dysplasia of cervix    colposcopy   GERD (gastroesophageal reflux disease)    uses OTC PRN   History of stomach ulcers      Past Surgical History:  Procedure Laterality Date   DILATION AND CURETTAGE OF UTERUS     INDUCED ABORTION     IR RADIOLOGIST EVAL & MGMT  03/19/2021   IR RADIOLOGIST EVAL & MGMT  04/16/2021   IR RADIOLOGIST EVAL & MGMT  08/14/2021   IR RADIOLOGIST EVAL & MGMT  10/27/2021    Family History  Problem Relation Age of Onset   Hypertension Mother    Tuberculosis Maternal Grandmother    Colon cancer Neg Hx    Colon polyps Neg Hx    Esophageal cancer Neg Hx    Rectal cancer Neg Hx    Stomach cancer Neg Hx     Social History   Tobacco Use   Smoking status: Never   Smokeless tobacco: Never  Vaping Use   Vaping Use: Never used  Substance Use Topics   Alcohol use: Yes    Comment: 1-2 times per month   Drug use: No    Comment: past hx of marijuana use     ROS   Objective:   Vitals: BP 114/77 (BP Location: Left Arm)   Pulse 79   Temp 98.2 F (36.8 C) (Oral)   Resp 18   LMP 12/03/2021 (Approximate)   SpO2 99%    Physical Exam Constitutional:      General: She is not in acute distress.    Appearance: Normal appearance. She is well-developed. She is not ill-appearing, toxic-appearing or diaphoretic.  HENT:     Head: Normocephalic and atraumatic.     Nose: Nose normal.     Mouth/Throat:     Mouth: Mucous membranes are moist.  Eyes:     General: No scleral icterus.       Right eye: No discharge.        Left eye: No discharge.     Extraocular Movements: Extraocular movements intact.  Cardiovascular:     Rate and Rhythm: Normal rate and regular rhythm.     Heart sounds: Normal heart sounds. No murmur heard.    No friction rub. No gallop.  Pulmonary:     Effort: Pulmonary effort is normal. No respiratory distress.     Breath sounds: No stridor. No wheezing, rhonchi or rales.  Chest:     Chest wall: No tenderness.  Skin:    General: Skin is warm  and dry.  Neurological:     General: No focal deficit present.     Mental Status: She is alert and oriented to person, place, and time.  Psychiatric:        Mood and Affect: Mood normal.        Behavior: Behavior normal.    Results for orders placed or performed during the hospital encounter of 12/31/21 (from the past 24 hour(s))  POCT CBG (manual entry)     Status: None   Collection Time: 12/31/21  3:00 PM  Result Value Ref Range   POCT Glucose ([facility]) 71 70 - 99 mg/dL    Assessment and Plan :   PDMP not reviewed this encounter.  1. Fatigue, unspecified type   2. Nausea without vomiting   3. Dizziness    I will pursue standard lab work.  Recommended supportive care for now.  COVID-19 testing pending.  Patient has hemodynamically stable vital signs and therefore appropriate for outpatient management.  Recommended close follow-up with her PCP as well. Counseled patient on potential for adverse effects with medications prescribed/recommended today, ER and return-to-clinic precautions discussed, patient verbalized understanding.    Wallis Bamberg, [place] 12/31/21 1549

## 2021-12-31 NOTE — ED Triage Notes (Signed)
Pt reports not feeling well the last couple days, dizziness on Tuesday and Wednesday and nausea beginning Wednesday. States she went to her throat doctor and noticed she has unexplained weight loss. Denies nausea and dizziness at this time, just feel fatigue. States wants blood sugar checked, and iron if possible. Hx of Anemia.

## 2022-01-01 LAB — CBC WITH DIFFERENTIAL/PLATELET
Basophils Absolute: 0.1 10*3/uL (ref 0.0–0.2)
Basos: 2 %
EOS (ABSOLUTE): 0.1 10*3/uL (ref 0.0–0.4)
Eos: 2 %
Hematocrit: 35.4 % (ref 34.0–46.6)
Hemoglobin: 11.4 g/dL (ref 11.1–15.9)
Immature Grans (Abs): 0 10*3/uL (ref 0.0–0.1)
Immature Granulocytes: 0 %
Lymphocytes Absolute: 2 10*3/uL (ref 0.7–3.1)
Lymphs: 44 %
MCH: 26.5 pg — ABNORMAL LOW (ref 26.6–33.0)
MCHC: 32.2 g/dL (ref 31.5–35.7)
MCV: 82 fL (ref 79–97)
Monocytes Absolute: 0.4 10*3/uL (ref 0.1–0.9)
Monocytes: 8 %
Neutrophils Absolute: 2 10*3/uL (ref 1.4–7.0)
Neutrophils: 44 %
Platelets: 419 10*3/uL (ref 150–450)
RBC: 4.31 x10E6/uL (ref 3.77–5.28)
RDW: 21.4 % — ABNORMAL HIGH (ref 11.7–15.4)
WBC: 4.5 10*3/uL (ref 3.4–10.8)

## 2022-01-01 LAB — COMPREHENSIVE METABOLIC PANEL
ALT: 7 IU/L (ref 0–32)
AST: 15 IU/L (ref 0–40)
Albumin/Globulin Ratio: 1.5 (ref 1.2–2.2)
Albumin: 4.9 g/dL (ref 3.9–4.9)
Alkaline Phosphatase: 46 IU/L (ref 44–121)
BUN/Creatinine Ratio: 7 — ABNORMAL LOW (ref 9–23)
BUN: 7 mg/dL (ref 6–20)
Bilirubin Total: 0.6 mg/dL (ref 0.0–1.2)
CO2: 19 mmol/L — ABNORMAL LOW (ref 20–29)
Calcium: 10.1 mg/dL (ref 8.7–10.2)
Chloride: 101 mmol/L (ref 96–106)
Creatinine, Ser: 0.96 mg/dL (ref 0.57–1.00)
Globulin, Total: 3.3 g/dL (ref 1.5–4.5)
Glucose: 84 mg/dL (ref 70–99)
Potassium: 3.8 mmol/L (ref 3.5–5.2)
Sodium: 139 mmol/L (ref 134–144)
Total Protein: 8.2 g/dL (ref 6.0–8.5)
eGFR: 79 mL/min/{1.73_m2} (ref >59)

## 2022-01-01 LAB — TSH: TSH: 1.42 u[IU]/mL (ref 0.450–4.500)

## 2022-01-01 LAB — SARS CORONAVIRUS 2 (TAT 6-24 HRS): SARS Coronavirus 2: NEGATIVE

## 2022-01-26 ENCOUNTER — Other Ambulatory Visit: Payer: Self-pay

## 2022-01-26 ENCOUNTER — Emergency Department (HOSPITAL_BASED_OUTPATIENT_CLINIC_OR_DEPARTMENT_OTHER)
Admission: EM | Admit: 2022-01-26 | Discharge: 2022-01-27 | Disposition: A | Discharge status: Home / Self Care | Payer: Medicaid Other | Attending: Emergency Medicine | Admitting: Emergency Medicine

## 2022-01-26 DIAGNOSIS — R109 Unspecified abdominal pain: Secondary | ICD-10-CM | POA: Diagnosis present

## 2022-01-26 DIAGNOSIS — K921 Melena: Secondary | ICD-10-CM | POA: Insufficient documentation

## 2022-01-26 DIAGNOSIS — R195 Other fecal abnormalities: Secondary | ICD-10-CM

## 2022-01-26 LAB — COMPREHENSIVE METABOLIC PANEL
ALT: 7 U/L (ref 0–44)
AST: 13 U/L — ABNORMAL LOW (ref 15–41)
Albumin: 4.5 g/dL (ref 3.5–5.0)
Alkaline Phosphatase: 35 U/L — ABNORMAL LOW (ref 38–126)
Anion gap: 9 (ref 5–15)
BUN: 6 mg/dL (ref 6–20)
CO2: 25 mmol/L (ref 22–32)
Calcium: 9.9 mg/dL (ref 8.9–10.3)
Chloride: 105 mmol/L (ref 98–111)
Creatinine, Ser: 0.8 mg/dL (ref 0.44–1.00)
GFR, Estimated: >60 mL/min (ref >60)
Glucose, Bld: 95 mg/dL (ref 70–99)
Potassium: 3.6 mmol/L (ref 3.5–5.1)
Sodium: 139 mmol/L (ref 135–145)
Total Bilirubin: 0.3 mg/dL (ref 0.3–1.2)
Total Protein: 7.6 g/dL (ref 6.5–8.1)

## 2022-01-26 LAB — CBC
HCT: 35.9 % — ABNORMAL LOW (ref 36.0–46.0)
Hemoglobin: 11.6 g/dL — ABNORMAL LOW (ref 12.0–15.0)
MCH: 28.6 pg (ref 26.0–34.0)
MCHC: 32.3 g/dL (ref 30.0–36.0)
MCV: 88.4 fL (ref 80.0–100.0)
Platelets: 252 10*3/uL (ref 150–400)
RBC: 4.06 MIL/uL (ref 3.87–5.11)
RDW: 20.5 % — ABNORMAL HIGH (ref 11.5–15.5)
WBC: 4.4 10*3/uL (ref 4.0–10.5)
nRBC: 0 % (ref 0.0–0.2)

## 2022-01-26 LAB — PREGNANCY, URINE: Preg Test, Ur: NEGATIVE

## 2022-01-26 LAB — LIPASE, BLOOD: Lipase: 47 U/L (ref 11–51)

## 2022-01-26 NOTE — ED Triage Notes (Signed)
POV from home, ambulatory, alert and oriented x 4.  Pt sts that she's been having upper left sided abd pain x 3 days. Denies NVD. Sts that she had bloody stool PTA but unsure if its from period or stool. Sts it was bright red at home.

## 2022-01-27 LAB — URINALYSIS, MICROSCOPIC (REFLEX)

## 2022-01-27 LAB — URINALYSIS, ROUTINE W REFLEX MICROSCOPIC
Bilirubin Urine: NEGATIVE
Glucose, UA: NEGATIVE mg/dL
Ketones, ur: NEGATIVE mg/dL
Leukocytes,Ua: NEGATIVE
Nitrite: NEGATIVE
Protein, ur: NEGATIVE mg/dL
Specific Gravity, Urine: <1.005 — ABNORMAL LOW (ref 1.005–1.030)
pH: 6.5 (ref 5.0–8.0)

## 2022-01-27 NOTE — Discharge Instructions (Signed)
You were seen today with concern for dark stools.  Your hemoglobin is stable.  Given that you are on iron, this is likely the cause.  If you have notable blood or change in symptoms, you should be reevaluated.

## 2022-01-27 NOTE — ED Provider Notes (Signed)
[facility] Provider Note   CSN: [id number] Arrival date & time: 01/26/22  2209     History  Chief Complaint  Patient presents with   Abdominal Pain    Makayla Russell is a 36 y.o. female.  HPI     This is a 36 year old female who presents with concerns for dark stools.  Patient reports 3 to 4-day history of dark stools.  She has been on iron for approximately 30 days.  She reports consistency of stools is normal.  She has had intermittent left-sided abdominal discomfort that seems to come and go.  Currently she is pain-free.  Denies any urinary symptoms.  Denies any fevers.  Denies any frank blood in her stool; however, she just got off of her.  So is unsure whether she may have had some blood in her stool in the last several days.  Denies any NSAID use or history of upper GI bleeds.  She is not on any anticoagulants.  Home Medications Prior to Admission medications   Medication Sig Start Date End Date Taking? Authorizing Provider  albuterol (ACCUNEB) 1.25 MG/3ML nebulizer solution Take 1 ampule by nebulization every 6 (six) hours as needed for wheezing.    [provider]  ondansetron (ZOFRAN-ODT) 8 MG disintegrating tablet Take 1 tablet (8 mg total) by mouth every 8 (eight) hours as needed for nausea or vomiting. 12/31/21   Jaynee Eagles, PA-C      Allergies    Patient has no known allergies.    Review of Systems   Review of Systems  Gastrointestinal:  Positive for abdominal pain. Negative for nausea and vomiting.       Dark stools  All other systems reviewed and are negative.   Physical Exam Updated Vital Signs BP 116/85   Pulse 74   Temp (!) 97.4 F (36.3 C)   Resp 18   Ht 1.676 m (5\' 6" )   Wt 59.9 kg   LMP 01/23/2022   SpO2 100%   BMI 21.31 kg/m  Physical Exam Vitals and nursing note reviewed.  Constitutional:      Appearance: She is well-developed. She is not ill-appearing.  HENT:     Head: Normocephalic and atraumatic.   Eyes:     Pupils: Pupils are equal, round, and reactive to light.  Cardiovascular:     Rate and Rhythm: Normal rate and regular rhythm.     Heart sounds: Normal heart sounds.  Pulmonary:     Effort: Pulmonary effort is normal. No respiratory distress.     Breath sounds: No wheezing.  Abdominal:     General: Bowel sounds are normal.     Palpations: Abdomen is soft.     Tenderness: There is no abdominal tenderness. There is no guarding or rebound.  Genitourinary:    Comments: Patient declined Hemoccult Musculoskeletal:     Cervical back: Neck supple.  Skin:    General: Skin is warm and dry.  Neurological:     General: No focal deficit present.     Mental Status: She is alert and oriented to person, place, and time.     ED Results / Procedures / Treatments   Labs (all labs ordered are listed, but only abnormal results are displayed) Labs Reviewed  COMPREHENSIVE METABOLIC PANEL - Abnormal; Notable for the following components:      Result Value   AST 13 (*)    Alkaline Phosphatase 35 (*)    All other components within normal limits  CBC -  Abnormal; Notable for the following components:   Hemoglobin 11.6 (*)    HCT 35.9 (*)    RDW 20.5 (*)    All other components within normal limits  URINALYSIS, ROUTINE W REFLEX MICROSCOPIC - Abnormal; Notable for the following components:   Specific Gravity, Urine <1.005 (*)    Hgb urine dipstick SMALL (*)    All other components within normal limits  URINALYSIS, MICROSCOPIC (REFLEX) - Abnormal; Notable for the following components:   Bacteria, UA RARE (*)    All other components within normal limits  LIPASE, BLOOD  PREGNANCY, URINE  POC OCCULT BLOOD, ED    EKG None  Radiology No results found.  Procedures Procedures    Medications Ordered in ED Medications - No data to display  ED Course/ Medical Decision Making/ A&P                           Medical Decision Making Amount and/or Complexity of Data Reviewed Labs:  ordered.   This patient presents to the ED for concern of dark stools, this involves an extensive number of treatment options, and is a complaint that carries with it a high risk of complications and morbidity.  I considered the following differential and admission for this acute, potentially life threatening condition.  The differential diagnosis includes secondary to iron, melena, GI bleed, diverticulitis  MDM:    This is a 36 year old female who presents with concern for dark stools.  She is nontoxic and vital signs are reassuring.  Abdominal exam is completely benign.  No tenderness to palpation on exam.  She does report some left-sided discomfort at times but currently is asymptomatic.  Low suspicion for acute diverticulitis.  Labs obtained and reviewed.  No leukocytosis.  Hemoglobin is stable at 11.6.  Patient declined rectal exam and Hemoccult testing.  Given that she is on iron, this is highly culprit for her dark stools with a stable hemoglobin; however I discussed with the patient that I cannot fully rule out occult blood.  She is understanding and states she will follow-up with her primary physician.  (Labs, imaging, consults)  Labs: I Ordered, and personally interpreted labs.  The pertinent results include: CBC, CMP, lipase, urinalysis  Imaging Studies ordered: I ordered imaging studies including none I independently visualized and interpreted imaging. I agree with the radiologist interpretation  Additional history obtained from chart review.  External records from outside source obtained and reviewed including prior evaluations  Cardiac Monitoring: The patient was maintained on a cardiac monitor.  I personally viewed and interpreted the cardiac monitored which showed an underlying rhythm of: Sinus rhythm  Reevaluation: After the interventions noted above, I reevaluated the patient and found that they have :stayed the same  Social Determinants of Health: Lives  independently  Disposition: Discharge  Co morbidities that complicate the patient evaluation  Past Medical History:  Diagnosis Date   Abortion    x 2   Anemia    hx of   Asthma    uses inhaler PRN   Blood transfusion without reported diagnosis 2021   x 2    Clot    in ovary vein 2018   Depression    hx of   Dysplasia of cervix    colposcopy   GERD (gastroesophageal reflux disease)    uses OTC PRN   History of stomach ulcers      Medicines No orders of the defined types were placed in this  encounter.   I have reviewed the patients home medicines and have made adjustments as needed  Problem List / ED Course: Problem List Items Addressed This Visit   None Visit Diagnoses     Dark stools    -  Primary                   Final Clinical Impression(s) / ED Diagnoses Final diagnoses:  Dark stools    Rx / DC Orders ED Discharge Orders     None         Merryl Hacker, MD 01/27/22 0101

## 2022-02-08 ENCOUNTER — Ambulatory Visit
Admission: EM | Admit: 2022-02-08 | Discharge: 2022-02-08 | Disposition: A | Discharge status: Home / Self Care | Payer: Medicaid Other | Attending: Family Medicine | Admitting: Family Medicine

## 2022-02-08 DIAGNOSIS — R051 Acute cough: Secondary | ICD-10-CM | POA: Insufficient documentation

## 2022-02-08 DIAGNOSIS — J019 Acute sinusitis, unspecified: Secondary | ICD-10-CM | POA: Insufficient documentation

## 2022-02-08 DIAGNOSIS — Z79899 Other long term (current) drug therapy: Secondary | ICD-10-CM | POA: Insufficient documentation

## 2022-02-08 DIAGNOSIS — Z1152 Encounter for screening for COVID-19: Secondary | ICD-10-CM | POA: Diagnosis not present

## 2022-02-08 DIAGNOSIS — Z7952 Long term (current) use of systemic steroids: Secondary | ICD-10-CM | POA: Insufficient documentation

## 2022-02-08 MED ORDER — CEFDINIR 300 MG PO CAPS: 600.0000 mg | ORAL_CAPSULE | Freq: Every day | ORAL | 0 refills | Status: AC

## 2022-02-08 MED ORDER — PREDNISONE 20 MG PO TABS: 40.0000 mg | ORAL_TABLET | Freq: Every day | ORAL | 0 refills | Status: AC

## 2022-02-08 NOTE — ED Triage Notes (Signed)
Pt states she has been having a productive cough.  Started: about two weeks

## 2022-02-08 NOTE — Discharge Instructions (Addendum)
  You have been swabbed for COVID, and the test will result in the next 24 hours. Our staff will call you if positive.   We have also drawn blood to check your blood counts  Take cefdinir 300 mg--2 capsules together daily for 7 days  Take prednisone 20 mg--2 daily for 3 days

## 2022-02-08 NOTE — ED Provider Notes (Signed)
[facility]    CSN: [id number] Arrival date & time: 02/08/22  1543      History   Chief Complaint Chief Complaint  Patient presents with   Cough    HPI Makayla Russell is a 36 y.o. female.    Cough  Here for 2-week history of congestion, cough, and postnasal drainage.  She has been bringing up a lot of mucus and blowing a lot of mucus.  She has had some sore throat at times.  She threw up once yesterday, but otherwise has not really had any nausea.  No fever at any point.  She has not completely improved with symptom resolution in this 2-week.  Does have a history of asthma but has felt more of a chest burning than any shortness of breath or tightness.  Notes that she has lost some weight and request that we do a COVID test and also that we do a CBC on her.  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 Authorizing Provider  ascorbic acid (VITAMIN C) 500 MG tablet Take  by mouth. 12/11/21 12/11/22 Yes [provider]  cefdinir (OMNICEF) 300 MG capsule Take 2 capsules (600 mg total) by mouth daily for 7 days. 02/08/22 02/15/22 Yes Zenia Resides, MD  ferrous sulfate 325 (65 FE) MG tablet Take 1 tablet by mouth daily with breakfast. 12/11/21 12/11/22 Yes [provider]  predniSONE (DELTASONE) 20 MG tablet Take 2 tablets (40 mg total) by mouth daily with breakfast for 3 days. 02/08/22 02/11/22 Yes Waylon Koffler, Janace Aris, MD  albuterol (ACCUNEB) 1.25 MG/3ML nebulizer solution Take 1 ampule by nebulization every 6 (six) hours as needed for wheezing.    [provider]  ondansetron (ZOFRAN-ODT) 8 MG disintegrating tablet Take 1 tablet (8 mg total) by mouth every 8 (eight) hours as needed for nausea or vomiting. 12/31/21   Urban Gibson,  Marquita Palms, PA-C    Family History Family History  Problem Relation Age of Onset   Hypertension Mother    Tuberculosis Maternal Grandmother    Colon cancer Neg Hx    Colon polyps Neg Hx    Esophageal cancer Neg Hx    Rectal cancer Neg Hx    Stomach cancer Neg Hx     Social History Social History   Tobacco Use   Smoking status: Never   Smokeless tobacco: Never  Vaping Use   Vaping Use: Never used  Substance Use Topics   Alcohol use: Yes    Comment: 1-2 times per month   Drug use: No    Comment: past hx of marijuana use      Allergies   Patient has no known allergies.   Review of Systems Review of Systems  Respiratory:  Positive for cough.      Physical Exam Triage Vital Signs ED Triage Vitals  Enc Vitals Group     BP 02/08/22 1751 105/63     Pulse Rate 02/08/22 1751 62     Resp 02/08/22 1751 16     Temp 02/08/22 1751 98.5 F (36.9 C)     Temp Source 02/08/22 1751 Oral     SpO2 02/08/22 1751 100 %     Weight 02/08/22 1745 121 lb 8 oz (55.1 kg)     Height --      Head Circumference --      Peak Flow --      Pain Score 02/08/22 1743 1     Pain Loc --      Pain Edu? --      Excl. in GC? --     No data found.  Updated Vital Signs BP 105/63 (BP Location: Left Arm)   Pulse 62   Temp 98.5 F (36.9 C) (Oral)   Resp 16   Wt 55.1 kg   LMP 01/23/2022   SpO2 100%   BMI 19.61 kg/m   Visual Acuity Right Eye Distance:   Left Eye Distance:   Bilateral Distance:    Right Eye Near:   Left Eye Near:    Bilateral Near:     Physical Exam Vitals reviewed.  Constitutional:      General: She is not in acute distress.    Appearance: She is not ill-appearing, toxic-appearing or diaphoretic.  HENT:     Right Ear: Tympanic membrane and ear canal normal.     Left Ear: Tympanic membrane and ear canal normal.     Nose: Congestion present.     Mouth/Throat:     Mouth: Mucous membranes are moist.     Comments: There is clear mucus draining in the oropharynx.  There is no erythema Eyes:     Extraocular Movements: Extraocular movements intact.     Conjunctiva/sclera: Conjunctivae normal.     Pupils: Pupils are equal, round, and reactive to light.  Cardiovascular:     Rate and Rhythm: Normal rate and regular rhythm.     Heart sounds: No murmur heard. Pulmonary:     Effort: Pulmonary effort is normal. No respiratory distress.     Breath sounds: No stridor. No wheezing, rhonchi or rales.  Musculoskeletal:     Cervical back: Neck supple.  Lymphadenopathy:     Cervical: No cervical adenopathy.  Skin:    Capillary Refill: Capillary refill takes less than 2 seconds.     Coloration: Skin is not jaundiced or pale.  Neurological:  General: No focal deficit present.     Mental Status: She is alert and oriented to person, place, and time.  Psychiatric:        Behavior: Behavior normal.      UC Treatments / Results  Labs (all labs ordered are listed, but only abnormal results are displayed) Labs Reviewed  SARS CORONAVIRUS 2 (TAT 6-24 HRS)  CBC    EKG   Radiology No results found.  Procedures Procedures (including critical care time)  Medications Ordered in  UC Medications - No data to display  Initial Impression / Assessment and Plan / UC Course  I have reviewed the triage vital signs and the nursing notes.  Pertinent labs & imaging results that were available during my care of the patient were reviewed by me and considered in my medical decision making (see chart for details).        I am going to prescribe Omnicef and 3 days of prednisone for possible sinus-itis and asthma exacerbation.  CBC and COVID swab is done at her request.  I discussed with her that there would not be any therapeutic intervention that would be helpful at this time for COVID if it were positive Final Clinical Impressions(s) / UC Diagnoses   Final diagnoses:  Acute cough  Acute sinusitis, recurrence not specified, unspecified location     Discharge Instructions       You have been swabbed for COVID, and the test will result in the next 24 hours. Our staff will call you if positive.   We have also drawn blood to check your blood counts  Take cefdinir 300 mg--2 capsules together daily for 7 days  Take prednisone 20 mg--2 daily for 3 days      ED Prescriptions     Medication Sig Dispense Auth. Provider   cefdinir (OMNICEF) 300 MG capsule Take 2 capsules (600 mg total) by mouth daily for 7 days. 14 capsule Barrett Henle, MD   predniSONE (DELTASONE) 20 MG tablet Take 2 tablets (40 mg total) by mouth daily with breakfast for 3 days. 6 tablet Windy Carina Gwenlyn Perking, MD      PDMP not reviewed this encounter.   Barrett Henle, MD 02/08/22 [id number]

## 2022-02-10 LAB — CBC
Hematocrit: 37.6 % (ref 34.0–46.6)
Hemoglobin: 12.2 g/dL (ref 11.1–15.9)
MCH: 30 pg (ref 26.6–33.0)
MCHC: 32.4 g/dL (ref 31.5–35.7)
MCV: 93 fL (ref 79–97)
Platelets: 352 10*3/uL (ref 150–450)
RBC: 4.06 x10E6/uL (ref 3.77–5.28)
RDW: 18.5 % — ABNORMAL HIGH (ref 11.7–15.4)
WBC: 4.3 10*3/uL (ref 3.4–10.8)

## 2022-02-10 LAB — SARS CORONAVIRUS 2 (TAT 6-24 HRS): SARS Coronavirus 2: NEGATIVE

## 2022-02-25 ENCOUNTER — Ambulatory Visit (INDEPENDENT_AMBULATORY_CARE_PROVIDER_SITE_OTHER): Payer: Medicaid Other

## 2022-02-25 ENCOUNTER — Ambulatory Visit (INDEPENDENT_AMBULATORY_CARE_PROVIDER_SITE_OTHER): Payer: Medicaid Other | Admitting: Nurse Practitioner

## 2022-02-25 VITALS — BP 100/74 | HR 75 | Temp 98.1°F | Ht 66.0 in | Wt 118.0 lb

## 2022-02-25 DIAGNOSIS — R634 Abnormal weight loss: Secondary | ICD-10-CM

## 2022-02-25 DIAGNOSIS — R61 Generalized hyperhidrosis: Secondary | ICD-10-CM | POA: Insufficient documentation

## 2022-02-25 DIAGNOSIS — R5383 Other fatigue: Secondary | ICD-10-CM

## 2022-02-25 LAB — COMPREHENSIVE METABOLIC PANEL
ALT: 9 U/L (ref 0–35)
AST: 16 U/L (ref 0–37)
Albumin: 4.8 g/dL (ref 3.5–5.2)
Alkaline Phosphatase: 42 U/L (ref 39–117)
BUN: 5 mg/dL — ABNORMAL LOW (ref 6–23)
CO2: 27 mEq/L (ref 19–32)
Calcium: 10.1 mg/dL (ref 8.4–10.5)
Chloride: 103 mEq/L (ref 96–112)
Creatinine, Ser: 0.82 mg/dL (ref 0.40–1.20)
GFR: 91.59 mL/min (ref >60.00)
Glucose, Bld: 82 mg/dL (ref 70–99)
Potassium: 3.5 mEq/L (ref 3.5–5.1)
Sodium: 138 mEq/L (ref 135–145)
Total Bilirubin: 0.7 mg/dL (ref 0.2–1.2)
Total Protein: 8.3 g/dL (ref 6.0–8.3)

## 2022-02-25 LAB — URINALYSIS WITH CULTURE, IF INDICATED
Ketones, ur: 15 — AB
Leukocytes,Ua: NEGATIVE
Nitrite: NEGATIVE
Specific Gravity, Urine: >=1.03 — AB (ref 1.000–1.030)
Total Protein, Urine: NEGATIVE
Urine Glucose: NEGATIVE
Urobilinogen, UA: 0.2 (ref 0.0–1.0)
pH: 5.5 (ref 5.0–8.0)

## 2022-02-25 LAB — CBC WITH DIFFERENTIAL/PLATELET
Basophils Absolute: 0.1 10*3/uL (ref 0.0–0.1)
Basophils Relative: 2.4 % (ref 0.0–3.0)
Eosinophils Absolute: 0 10*3/uL (ref 0.0–0.7)
Eosinophils Relative: 1.7 % (ref 0.0–5.0)
HCT: 40.8 % (ref 36.0–46.0)
Hemoglobin: 13.4 g/dL (ref 12.0–15.0)
Lymphocytes Relative: 50.8 % — ABNORMAL HIGH (ref 12.0–46.0)
Lymphs Abs: 1.5 10*3/uL (ref 0.7–4.0)
MCHC: 32.8 g/dL (ref 30.0–36.0)
MCV: 92.4 fl (ref 78.0–100.0)
Monocytes Absolute: 0.1 10*3/uL (ref 0.1–1.0)
Monocytes Relative: 5 % (ref 3.0–12.0)
Neutro Abs: 1.2 10*3/uL — ABNORMAL LOW (ref 1.4–7.7)
Neutrophils Relative %: 40.1 % — ABNORMAL LOW (ref 43.0–77.0)
Platelets: 309 10*3/uL (ref 150.0–400.0)
RBC: 4.42 Mil/uL (ref 3.87–5.11)
RDW: 18.2 % — ABNORMAL HIGH (ref 11.5–15.5)
WBC: 2.9 10*3/uL — ABNORMAL LOW (ref 4.0–10.5)

## 2022-02-25 LAB — HEMOCCULT GUIAC POC 1CARD (OFFICE): Fecal Occult Blood, POC: NEGATIVE

## 2022-02-25 LAB — POCT URINE PREGNANCY: Preg Test, Ur: NEGATIVE

## 2022-02-25 LAB — LIPID PANEL
Cholesterol: 182 mg/dL (ref 0–200)
HDL: 51.5 mg/dL (ref >39.00)
LDL Cholesterol: 118 mg/dL — ABNORMAL HIGH (ref 0–99)
NonHDL: 130.69
Total CHOL/HDL Ratio: 4
Triglycerides: 63 mg/dL (ref 0.0–149.0)
VLDL: 12.6 mg/dL (ref 0.0–40.0)

## 2022-02-25 LAB — SEDIMENTATION RATE: Sed Rate: 15 mm/hr (ref 0–20)

## 2022-02-25 LAB — C-REACTIVE PROTEIN: CRP: <1 mg/dL (ref 0.5–20.0)

## 2022-02-25 LAB — TSH: TSH: 1.68 u[IU]/mL (ref 0.35–5.50)

## 2022-02-25 LAB — T3, FREE: T3, Free: 4 pg/mL (ref 2.3–4.2)

## 2022-02-25 LAB — HEMOGLOBIN A1C: Hgb A1c MFr Bld: 5 % (ref 4.6–6.5)

## 2022-02-25 LAB — T4, FREE: Free T4: 0.81 ng/dL (ref 0.60–1.60)

## 2022-02-25 NOTE — Assessment & Plan Note (Signed)
Etiology unclear, will order labs and chest x-ray for further evaluation.  Further recommendations may be made based upon these results.

## 2022-02-25 NOTE — Assessment & Plan Note (Signed)
Etiology unclear, will order labs and chest x-ray for further evaluation.  Further recommendations may be made based upon these results. 

## 2022-02-25 NOTE — Progress Notes (Signed)
New Patient Office Visit  Subjective    Patient ID: Makayla Russell, female    DOB: 11-Feb-1986  Age: 36 y.o. MRN: [id number]  CC:  Chief Complaint  Patient presents with   Weight Loss    HPI Makayla Russell presents to establish care She recently establish care with a new PCP, but has decided to switch to this office.  Prior to this establishment she was just going to urgent care as needed.  Her main concern is that she has noticed unintentional weight loss over the last few months.  She is especially concerned about possibility of having been infected with HIV.  She reports having had unprotected sex with new sexual partner, however their HIV status has not been verified.  She does report that her anxiety has been worse lately and she does report some panic attacks.  Denies thoughts of suicide.  Has 5 kids all of whom are sick currently.  Per chart review I see that she has dropped about 6 kg over the last 10 weeks or so.  She does report that she has been having episodes of diaphoresis.  Does have a grandmother who had tuberculosis, she did not live with her grandmother at any point in time.  She is overdue for Pap smear.  Has appointment with OB/GYN next month for this.  Does have history of H. pylori with ulcerations, has completed treatment for this.  Was having abdominal pain when she was diagnosed H. pylori, reports now abdominal pain has resolved.  Did notice some blood in her stool, but was not sure if this was actually from vaginal source during menstrual period.  Is currently taking iron for known iron deficiency anemia.  Per chart review she had normal hemoglobin level earlier this month, has had normal electrolytes and kidney function within the last month or so, had small amount of hemoglobin noted on urinalysis last month however she was menstruating at that time.  Outpatient Encounter Medications as of 02/25/2022  Medication Sig   albuterol (ACCUNEB) 1.25 MG/3ML nebulizer solution  Take 1 ampule by nebulization every 6 (six) hours as needed for wheezing.   ascorbic acid (VITAMIN C) 500 MG tablet Take by mouth.   ferrous sulfate 325 (65 FE) MG tablet Take 1 tablet by mouth daily with breakfast.   [DISCONTINUED] ondansetron (ZOFRAN-ODT) 8 MG disintegrating tablet Take 1 tablet (8 mg total) by mouth every 8 (eight) hours as needed for nausea or vomiting.   No facility-administered encounter medications on file as of 02/25/2022.    Past Medical History:  Diagnosis Date   Abortion    x 2   Anemia    hx of   Asthma    uses inhaler PRN   Blood transfusion without reported diagnosis 2021   x 2    Clot    in ovary vein 2018   Depression    hx of   Dysplasia of cervix    colposcopy   GERD (gastroesophageal reflux disease)    uses OTC PRN   History of stomach ulcers     Past Surgical History:  Procedure Laterality Date   DILATION AND CURETTAGE OF UTERUS     INDUCED ABORTION     IR RADIOLOGIST EVAL & MGMT  03/19/2021   IR RADIOLOGIST EVAL & MGMT  04/16/2021   IR RADIOLOGIST EVAL & MGMT  08/14/2021   IR RADIOLOGIST EVAL & MGMT  10/27/2021    Family History  Problem Relation Age of Onset  Hypertension Mother    Tuberculosis Maternal Grandmother    Colon cancer Neg Hx    Colon polyps Neg Hx    Esophageal cancer Neg Hx    Rectal cancer Neg Hx    Stomach cancer Neg Hx     Social History   Socioeconomic History   Marital status: Single    Spouse name: Not on file   Number of children: 5   Years of education: Not on file   Highest education level: Not on file  Occupational History   Not on file  Tobacco Use   Smoking status: Never   Smokeless tobacco: Never  Vaping Use   Vaping Use: Never used  Substance and Sexual Activity   Alcohol use: Yes    Comment: 1-2 times per month   Drug use: No    Comment: past hx of marijuana use    Sexual activity: Yes    Birth control/protection: None  Other Topics Concern   Not on file  Social History Narrative    Not on file   Social Determinants of Health   Financial Resource Strain: Not on file  Food Insecurity: No Food Insecurity (12/24/2020)   Hunger Vital Sign    Worried About Running Out of Food in the Last Year: Never true    Ran Out of Food in the Last Year: Never true  Transportation Needs: Unmet Transportation Needs (05/13/2020)   PRAPARE - Hydrologist (Medical): Yes    Lack of Transportation (Non-Medical): Yes  Physical Activity: Not on file  Stress: Not on file  Social Connections: Not on file  Intimate Partner Violence: Not on file    Review of Systems  Constitutional:  Positive for diaphoresis, malaise/fatigue and weight loss. Negative for chills and fever.  Eyes:  Negative for blurred vision and double vision.  Respiratory:  Negative for cough and wheezing.   Cardiovascular:  Positive for chest pain (tightness; may be related to panic attacks). Negative for palpitations.  Gastrointestinal:  Positive for blood in stool (has had dark stools, had 1 episode of seeing scant blood in stool) and melena. Negative for abdominal pain.  Genitourinary:  Negative for hematuria.  Neurological:  Negative for seizures, loss of consciousness and headaches.  Psychiatric/Behavioral:  Positive for depression. Negative for suicidal ideas. The patient is nervous/anxious and has insomnia (wakes up in the middle of the night).         Objective    BP 100/74 (BP Location: Right Arm, Patient Position: Sitting)   Pulse 75   Temp 98.1 F (36.7 C) (Oral)   Ht 5\' 6"  (1.676 m)   Wt 118 lb (53.5 kg)   LMP 02/18/2022   SpO2 99%   BMI 19.05 kg/m      02/25/2022    1:09 PM 12/24/2020    4:20 PM 05/13/2020    3:42 PM  PHQ9 SCORE ONLY  PHQ-9 Total Score 6 3 4      Physical Exam Vitals reviewed. Exam conducted with a chaperone present.  Constitutional:      General: She is not in acute distress.    Appearance: Normal appearance.  HENT:     Head: Normocephalic  and atraumatic.  Neck:     Vascular: No carotid bruit.  Cardiovascular:     Rate and Rhythm: Normal rate and regular rhythm.     Pulses: Normal pulses.     Heart sounds: Normal heart sounds.  Pulmonary:     Effort: Pulmonary  effort is normal.     Breath sounds: Normal breath sounds.  Abdominal:     General: Abdomen is flat. Bowel sounds are normal. There is no distension.     Tenderness: There is no abdominal tenderness.  Genitourinary:    Rectum: Guaiac result negative. External hemorrhoid present. No mass, tenderness or anal fissure. Normal anal tone.  Skin:    General: Skin is warm and dry.  Neurological:     General: No focal deficit present.     Mental Status: She is alert and oriented to person, place, and time.  Psychiatric:        Mood and Affect: Mood normal.        Behavior: Behavior normal.        Judgment: Judgment normal.         Assessment & Plan:   Problem List Items Addressed This Visit       Other   Unintentional weight loss - Primary    Etiology unclear, will order labs and chest x-ray for further evaluation.  Further recommendations may be made based upon these results.      Relevant Orders   TSH   Hemoglobin A1c   Lipid panel   Comprehensive metabolic panel   HIV Antibody (routine testing w rflx)   RPR   GC/Chlamydia Probe Amp   Urinalysis with Culture, if indicated   T3, free   T4, free   C-reactive protein   Sedimentation rate   CBC with Differential/Platelet   Pathologist smear review   DG Chest 2 View   QuantiFERON-TB Gold Plus   POCT urine pregnancy   POCT Occult Blood Stool (Completed)   Fatigue    Etiology unclear, will order labs and chest x-ray for further evaluation.  Further recommendations may be made based upon these results.      Relevant Orders   TSH   Hemoglobin A1c   Lipid panel   Comprehensive metabolic panel   HIV Antibody (routine testing w rflx)   RPR   GC/Chlamydia Probe Amp   Urinalysis with Culture, if  indicated   T3, free   T4, free   C-reactive protein   Sedimentation rate   CBC with Differential/Platelet   Pathologist smear review   DG Chest 2 View   QuantiFERON-TB Gold Plus   POCT urine pregnancy   POCT Occult Blood Stool (Completed)   Diaphoresis    Etiology unclear, will order labs and chest x-ray for further evaluation.  Further recommendations may be made based upon these results.      Relevant Orders   TSH   Hemoglobin A1c   Lipid panel   Comprehensive metabolic panel   HIV Antibody (routine testing w rflx)   RPR   GC/Chlamydia Probe Amp   Urinalysis with Culture, if indicated   T3, free   T4, free   C-reactive protein   Sedimentation rate   CBC with Differential/Platelet   Pathologist smear review   DG Chest 2 View   QuantiFERON-TB Gold Plus    Return in about 1 month (around 03/28/2022) for f/u with Maralyn Sago.   Elenore Paddy, NP

## 2022-02-26 ENCOUNTER — Encounter: Payer: Self-pay | Admitting: Nurse Practitioner

## 2022-02-26 ENCOUNTER — Other Ambulatory Visit: Payer: Self-pay | Admitting: Nurse Practitioner

## 2022-02-26 DIAGNOSIS — R195 Other fecal abnormalities: Secondary | ICD-10-CM

## 2022-02-26 DIAGNOSIS — D709 Neutropenia, unspecified: Secondary | ICD-10-CM

## 2022-02-26 DIAGNOSIS — R319 Hematuria, unspecified: Secondary | ICD-10-CM

## 2022-02-26 NOTE — Telephone Encounter (Signed)
Talked to pt and made aware that Jeralyn Ruths will go over her results once she reviews and call back to inform her about it

## 2022-02-26 NOTE — Progress Notes (Signed)
Patient called with concerns regarding her lab work.  Discussed that white blood cell count is low, and will refer to hematology for further evaluation.  She also had trace hemoglobin noted in urine which was there about 1 month ago as well.  She reports she is not menstruating currently.  Will refer to urology for further evaluation.  Patient also mention she seen some white spots in her stool today.  We will order some stool study testing, and she was directed to come to the lab to pick up specimen cup and return with specimen at her earliest convenience.  Patient reports understanding.

## 2022-02-28 LAB — GC/CHLAMYDIA PROBE AMP
Chlamydia trachomatis, NAA: NEGATIVE
Neisseria Gonorrhoeae by PCR: NEGATIVE

## 2022-03-01 ENCOUNTER — Telehealth: Payer: Self-pay | Admitting: Physician Assistant

## 2022-03-01 NOTE — Telephone Encounter (Signed)
Scheduled appt per 10/20 referral. Pt is aware of appt date and time. Pt is aware to arrive 15 mins prior to appt time and to bring and updated insurance card. Pt is aware of appt location.   

## 2022-03-02 ENCOUNTER — Telehealth: Payer: Self-pay | Admitting: Nurse Practitioner

## 2022-03-02 NOTE — Telephone Encounter (Signed)
Pt calling for lab results. Call pt at [phone]

## 2022-03-03 LAB — RPR: RPR Ser Ql: NONREACTIVE

## 2022-03-03 LAB — HIV ANTIBODY (ROUTINE TESTING W REFLEX): HIV 1&2 Ab, 4th Generation: NONREACTIVE

## 2022-03-03 LAB — QUANTIFERON-TB GOLD PLUS
Mitogen-NIL: >10 IU/mL
NIL: 0.03 IU/mL
QuantiFERON-TB Gold Plus: NEGATIVE
TB1-NIL: 0 IU/mL
TB2-NIL: 0.01 IU/mL

## 2022-03-03 LAB — PATHOLOGIST SMEAR REVIEW

## 2022-03-05 ENCOUNTER — Telehealth: Payer: Self-pay | Admitting: Nurse Practitioner

## 2022-03-05 NOTE — Telephone Encounter (Signed)
Pt called to request a return phone call. Pt states she does not look at [place] and wants a call to explain the reason for the referral to urology and reason for medication that was called in and results of labs and imaging. Call pt at [phone]

## 2022-03-08 NOTE — Telephone Encounter (Signed)
Called pt and made aware of lab results. Pt stated that urology has contacted her to schedule but not hematology. Gave her hematology to call.

## 2022-03-10 ENCOUNTER — Encounter: Payer: Self-pay | Admitting: Nurse Practitioner

## 2022-03-10 ENCOUNTER — Ambulatory Visit: Payer: Medicaid Other | Admitting: Obstetrics and Gynecology

## 2022-03-11 NOTE — Telephone Encounter (Signed)
Talked to pt about issues that she is concern about, let her know she was screen for HIV and it back negative and also let her know she was referral to urologist and hematology for further evaluation. Pt stated she has set up an appointment for it

## 2022-03-13 NOTE — Progress Notes (Unsigned)
[place] [phone]   [phone]  CONSULT NOTE  REFERRING PHYSICIAN: Jeralyn Ruths   REASON FOR CONSULTATION:  Neutropenia   HPI Makayla Russell is a 36 y.o. female with a past medical history significant for asthma, iron deficiency anemia, and hyperlipidemia is referred to the clinic for neutropenia.   The patient recently establish care with a new PCP on 02/25/22. Prior to this the patient was frequently going to urgent care to the emergency room for her care.  She is most concerned about weight loss this day and worried about exposure to HIV.  She underwent HIV screening testing which was negative. She also reportedly was having diaphoresis and was concerned about exposure to her grandmother with tuberculosis.  She also has a history of H. pylori.  At the time of her last visit with her PCP her children were sick.  She had baseline labs performed which showed a low total white blood cell count at 2.9 and neutropenia with an ANC of 1.2.  Also around that time, the patient mentions that she has been taking wormwood and black walnut.  She had started this approximately 3 days prior to having her labs drawn.  She stopped taking this after her labs demonstrated neutropenia.  The oldest records available to me are from 2019.  She had one episode of mild neutropenia/leukopenia on 12/24/2021 in which her total white blood cell count was 3.6.  She does have a history of anemia.  She received a blood transfusion in 2019 and states she has not felt like herself since that time.  Fortunately, her anemia has improved.  She was taking iron supplements briefly a few months ago and has stopped with improvement in her hemoglobin.    Regarding the leukocytopenia, is not sure how long she has had these. Denies any recent or frequent infections except bacterial vaginosis. Denies any other frequent or recent viral infections including hepatitis or HIV.  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 Denies any alcohol use. She denies exposure to radiation or chemotherapy. Denies any nausea, vomiting, diarrhea, or constipation.  Denies any fever or chills.  She had one episode of night sweats a few months ago.  She denies any changes with her normal sleeping environment.  She states that he was sweating but did not drench her sheets.  She denies lymphadenopathy except she had bilateral submandibular adenopathy a few months ago.  She was evaluated in her lymph nodes improved a few days later. Denies any personal history of autoimmune disorders. She denies any history of any vitamin deficiencies.  She estimates she lost approximately 14 pounds in 2 months.  A few months ago.  She did report some changes in her dietary habits at that time and had cut out red meat.  She has since resumed eating red meat and has gained about 2 pounds back.  Denies liver disease, autoimmune disease, heart disease, or gum disease.  She denies rashes or joint swelling. She denies any tick bites. Denies early satiety or abdominal bloating. Denies any gastrointestinal diseases such as inflammatory bowel disease.  She denies any other NSAID or over-the-counter medication use.   Patient denies any family history of any malignancies, blood problems, or bone marrow disorders.  The patient's mother has hypertension, asthma, gallstones.  The patient's father has asthma and history of stroke.  The patient's history of bronchitis.    Past Medical History:  Diagnosis Date   Abortion    x 2   Anemia    hx of   Asthma    uses inhaler PRN   Blood transfusion without reported diagnosis 2021   x 2    Clot    in ovary vein 2018   Depression    hx of   Dysplasia of cervix    colposcopy   GERD (gastroesophageal reflux disease)    uses OTC PRN   History of stomach ulcers     Past Surgical History:  Procedure Laterality Date   DILATION AND CURETTAGE OF UTERUS      INDUCED ABORTION     IR RADIOLOGIST EVAL & MGMT  03/19/2021   IR RADIOLOGIST EVAL & MGMT  04/16/2021   IR RADIOLOGIST EVAL & MGMT  08/14/2021   IR RADIOLOGIST EVAL & MGMT  10/27/2021    Family History  Problem Relation Age of Onset   Hypertension Mother    Tuberculosis Maternal Grandmother    Colon cancer Neg Hx    Colon polyps Neg Hx    Esophageal cancer Neg Hx    Rectal cancer Neg Hx    Stomach cancer Neg Hx     Social History Social History   Tobacco Use   Smoking status: Never   Smokeless tobacco: Never  Vaping Use   Vaping Use: Never used  Substance Use Topics   Alcohol use: Yes    Comment: 1-2 times per month   Drug use: No    Comment: past hx of marijuana use     No Known Allergies  Current Outpatient Medications  Medication Sig Dispense Refill   albuterol (ACCUNEB) 1.25 MG/3ML nebulizer solution Take 1 ampule by nebulization every 6 (six) hours as needed for wheezing.     ascorbic acid (VITAMIN C) 500 MG tablet Take by mouth.     ferrous sulfate 325 (65 FE) MG tablet Take 1 tablet by mouth daily with breakfast. (Patient not taking: Reported on 03/15/2022)     No current facility-administered medications for this visit.    REVIEW OF SYSTEMS:   Review of Systems  Constitutional: Today for fatigue and weight loss.  Negative for appetite change, chills, and fever.  HENT: Positive for occasional cold sores.  Negative for mouth sores, nosebleeds, sore throat and trouble swallowing.   Eyes: Negative for eye problems and icterus.  Respiratory: Negative for cough, hemoptysis, shortness of breath and wheezing.   Cardiovascular: Negative for chest pain and leg swelling.  Gastrointestinal: Negative for abdominal pain, constipation, diarrhea, nausea and vomiting.  Genitourinary: Negative for bladder incontinence, difficulty urinating, dysuria, frequency and hematuria.   Musculoskeletal: Negative for back pain, gait problem, neck pain and neck stiffness.  Skin: Negative for  itching and rash.  Neurological: Negative for dizziness, extremity weakness, gait problem, headaches, light-headedness and seizures.  Hematological: Negative for adenopathy. Does not bruise/bleed easily.  Psychiatric/Behavioral: Negative for confusion, depression and sleep disturbance. The patient is not nervous/anxious.     PHYSICAL EXAMINATION:  Blood pressure 109/68, pulse 80, temperature 98 F (36.7 C), temperature source Oral, resp. rate 15, weight 122 lb 14.4 oz (55.7 kg), last menstrual period 02/18/2022, SpO2 100 %.  ECOG PERFORMANCE STATUS: 1  Physical Exam  Constitutional: Oriented to person, place, and time and well-developed, well-nourished, and in no distress.  HENT:  Head: Normocephalic and atraumatic.  Mouth/Throat: Oropharynx is clear and moist. No oropharyngeal exudate.  Eyes: Conjunctivae are normal. Right eye exhibits no discharge. Left eye exhibits no  discharge. No scleral icterus.  Neck: Normal range of motion. Neck supple.  Cardiovascular: Normal rate, regular rhythm, normal heart sounds and intact distal pulses.   Pulmonary/Chest: Effort normal and breath sounds normal. No respiratory distress. No wheezes. No rales.  Abdominal: Soft. Bowel sounds are normal. Exhibits no distension and no mass. There is no tenderness.  Musculoskeletal: Normal range of motion. Exhibits no edema.  Lymphadenopathy:    No cervical adenopathy.  Neurological: Alert and oriented to person, place, and time. Exhibits normal muscle tone. Gait normal. Coordination normal.  Skin: Skin is warm and dry. No rash noted. Not diaphoretic. No erythema. No pallor.  Psychiatric: Mood, memory and judgment normal.  Vitals reviewed.  LABORATORY DATA: Lab Results  Component Value Date   WBC 3.9 (L) 03/15/2022   HGB 12.8 03/15/2022   HCT 37.8 03/15/2022   MCV 94.5 03/15/2022   PLT 310 03/15/2022      Chemistry      Component Value Date/Time   NA 139 03/15/2022 1219   NA 139 12/31/2021 1522    K 3.6 03/15/2022 1219   CL 106 03/15/2022 1219   CO2 28 03/15/2022 1219   BUN 7 03/15/2022 1219   BUN 7 12/31/2021 1522   CREATININE 0.85 03/15/2022 1219      Component Value Date/Time   CALCIUM 9.7 03/15/2022 1219   ALKPHOS 46 03/15/2022 1219   AST 12 (L) 03/15/2022 1219   ALT 7 03/15/2022 1219   BILITOT 0.5 03/15/2022 1219       RADIOGRAPHIC STUDIES: DG Chest 2 View  Result Date: 02/25/2022 CLINICAL DATA:  Chest tightness, unintentional weight loss EXAM: CHEST - 2 VIEW COMPARISON:  None Available. FINDINGS: The heart size and mediastinal contours are within normal limits. Both lungs are clear. The visualized skeletal structures are unremarkable. IMPRESSION: No active cardiopulmonary disease. Electronically Signed   By: Jerilynn Mages.  Shick M.D.   On: 02/25/2022 15:56    ASSESSMENT: This is a very pleasant 36 year old [place] American female referred to the clinic for leukopenia. .  The patient was seen Dr. Julien Nordmann today.  The patient several labs performed including a CBC, CMP, LDH, ANA, rheumatoid factor, acute hepatitis panel, vitamin F79, and folic acid testing today.  The patient CBC shows mild leukopenia with a total WBC of 3.9 and normal ANC at 1.8.  CMP, LDH, B12, and folate within normal limits.  Her other lab tests are pending at this time  Dr. Julien Nordmann feels that her low white blood cell count is likely reactive.  Could be secondary to her herbal supplement use which often has additives that are not regulated/pure.  Advised to discontinue her supplements which she already has in her white blood cell count has improved compared to 2 weeks ago.   Dr. Julien Nordmann does not feel that any invasive work-up such as a bone marrow biopsy and aspirate is necessary at this time.  Dr. Julien Nordmann recommends that she follow with her family doctor.  Of course if she has persistent leukopenia in the future is worsening and/or she gets frequent infections we will always be happy to reevaluate her sooner.    We will call her back once I have the results of all her lab test.   The patient voices understanding of current disease status and treatment options and is in agreement with the current care plan.  All questions were answered. The patient knows to call the clinic with any problems, questions or concerns. We can certainly see the patient much sooner  if necessary.  Thank you so much for allowing me to participate in the care of Makayla Russell. I will continue to follow up the patient with you and assist in her care.   Disclaimer: This note was dictated with voice recognition software. Similar sounding words can inadvertently be transcribed and may not be corrected upon review.   Robertha Staples L Anaja Monts March 15, 2022, 3:04 PM  ADDENDUM: Hematology/Oncology Attending: I had a face-to-face encounter with the patient today.  I reviewed her records, lab and recommended her care plan.  This is a very pleasant 36 years old African-American female who was referred to [place] for evaluation of pancytopenia.  The patient was seen by new primary care provider in October 2023 and during her evaluation she was found to have pancytopenia with total white blood count of 2.9 and absolute neutrophil count of 1200.  The patient has some illness at that time with cold symptoms and viral infection. She was seen at the emergency department several times in the past with concern about weight loss and her previous blood work was unremarkable. We ordered several studies on the patient today including repeat CBC that showed low total white blood count just above the normal range was 3.9.  She has normal hemoglobin and hematocrit as well as platelets count.  Her comprehensive metabolic panel, LDH as well as vitamin B12 and serum folate were unremarkable.  She also has normal hepatitis panel.  Other lab work including ANA and rheumatoid factor are still pending. I assured the patient that her blood count is not concerning  and it is probably was related to her most recent sickness few weeks ago and has almost recover close to the normal range. I recommended for her to continue her routine follow-up visit and evaluation by her primary care provider and we will see her on as-needed basis.  If there is any concerning abnormality on the pending blood work we will call the patient for recommendation. The patient is in agreement with the current plan. She was advised to call if she has any concerning symptoms in the interval. The total time spent in the appointment was 60 minutes. Disclaimer: This note was dictated with voice recognition software. Similar sounding words can inadvertently be transcribed and may be missed upon review. Eilleen Kempf, MD

## 2022-03-15 ENCOUNTER — Inpatient Hospital Stay: Payer: Medicaid Other

## 2022-03-15 ENCOUNTER — Other Ambulatory Visit: Payer: Self-pay | Admitting: Physician Assistant

## 2022-03-15 ENCOUNTER — Other Ambulatory Visit: Payer: Self-pay

## 2022-03-15 ENCOUNTER — Inpatient Hospital Stay: Payer: Medicaid Other | Attending: Physician Assistant | Admitting: Physician Assistant

## 2022-03-15 VITALS — BP 109/68 | HR 80 | Temp 98.0°F | Resp 15 | Wt 122.9 lb

## 2022-03-15 DIAGNOSIS — E785 Hyperlipidemia, unspecified: Secondary | ICD-10-CM | POA: Diagnosis not present

## 2022-03-15 DIAGNOSIS — D509 Iron deficiency anemia, unspecified: Secondary | ICD-10-CM | POA: Diagnosis present

## 2022-03-15 DIAGNOSIS — D72819 Decreased white blood cell count, unspecified: Secondary | ICD-10-CM | POA: Insufficient documentation

## 2022-03-15 DIAGNOSIS — D709 Neutropenia, unspecified: Secondary | ICD-10-CM

## 2022-03-15 LAB — CMP (CANCER CENTER ONLY)
ALT: 7 U/L (ref 0–44)
AST: 12 U/L — ABNORMAL LOW (ref 15–41)
Albumin: 4.5 g/dL (ref 3.5–5.0)
Alkaline Phosphatase: 46 U/L (ref 38–126)
Anion gap: 5 (ref 5–15)
BUN: 7 mg/dL (ref 6–20)
CO2: 28 mmol/L (ref 22–32)
Calcium: 9.7 mg/dL (ref 8.9–10.3)
Chloride: 106 mmol/L (ref 98–111)
Creatinine: 0.85 mg/dL (ref 0.44–1.00)
GFR, Estimated: >60 mL/min (ref >60)
Glucose, Bld: 71 mg/dL (ref 70–99)
Potassium: 3.6 mmol/L (ref 3.5–5.1)
Sodium: 139 mmol/L (ref 135–145)
Total Bilirubin: 0.5 mg/dL (ref 0.3–1.2)
Total Protein: 7.8 g/dL (ref 6.5–8.1)

## 2022-03-15 LAB — CBC WITH DIFFERENTIAL (CANCER CENTER ONLY)
Abs Immature Granulocytes: 0 10*3/uL (ref 0.00–0.07)
Basophils Absolute: 0 10*3/uL (ref 0.0–0.1)
Basophils Relative: 1 %
Eosinophils Absolute: 0.1 10*3/uL (ref 0.0–0.5)
Eosinophils Relative: 3 %
HCT: 37.8 % (ref 36.0–46.0)
Hemoglobin: 12.8 g/dL (ref 12.0–15.0)
Immature Granulocytes: 0 %
Lymphocytes Relative: 43 %
Lymphs Abs: 1.7 10*3/uL (ref 0.7–4.0)
MCH: 32 pg (ref 26.0–34.0)
MCHC: 33.9 g/dL (ref 30.0–36.0)
MCV: 94.5 fL (ref 80.0–100.0)
Monocytes Absolute: 0.2 10*3/uL (ref 0.1–1.0)
Monocytes Relative: 6 %
Neutro Abs: 1.8 10*3/uL (ref 1.7–7.7)
Neutrophils Relative %: 47 %
Platelet Count: 310 10*3/uL (ref 150–400)
RBC: 4 MIL/uL (ref 3.87–5.11)
RDW: 14.2 % (ref 11.5–15.5)
WBC Count: 3.9 10*3/uL — ABNORMAL LOW (ref 4.0–10.5)
nRBC: 0 % (ref 0.0–0.2)

## 2022-03-15 LAB — HEPATITIS PANEL, ACUTE
HCV Ab: NONREACTIVE
Hep A IgM: NONREACTIVE
Hep B C IgM: NONREACTIVE
Hepatitis B Surface Ag: NONREACTIVE

## 2022-03-15 LAB — LACTATE DEHYDROGENASE: LDH: 101 U/L (ref 98–192)

## 2022-03-15 LAB — VITAMIN B12: Vitamin B-12: 570 pg/mL (ref 180–914)

## 2022-03-15 LAB — FOLATE: Folate: 15.1 ng/mL (ref >5.9)

## 2022-03-16 LAB — RHEUMATOID FACTOR: Rheumatoid fact SerPl-aCnc: <10 IU/mL (ref <14.0)

## 2022-03-17 ENCOUNTER — Telehealth: Payer: Self-pay | Admitting: Physician Assistant

## 2022-03-17 ENCOUNTER — Ambulatory Visit: Payer: Medicaid Other | Admitting: Urology

## 2022-03-17 ENCOUNTER — Encounter: Payer: Self-pay | Admitting: Urology

## 2022-03-17 LAB — ANTINUCLEAR ANTIBODIES, IFA: ANA Ab, IFA: NEGATIVE

## 2022-03-17 NOTE — Telephone Encounter (Signed)
I called the patient to let her know her pending labs from her appointment earlier this week all came back normal.

## 2022-03-25 ENCOUNTER — Encounter: Payer: Self-pay | Admitting: Obstetrics and Gynecology

## 2022-03-25 ENCOUNTER — Ambulatory Visit (INDEPENDENT_AMBULATORY_CARE_PROVIDER_SITE_OTHER): Payer: Medicaid Other | Admitting: Obstetrics and Gynecology

## 2022-03-25 ENCOUNTER — Other Ambulatory Visit (HOSPITAL_COMMUNITY)
Admission: RE | Admit: 2022-03-25 | Discharge: 2022-03-25 | Disposition: A | Discharge status: Home / Self Care | Payer: Medicaid Other | Source: Ambulatory Visit | Attending: Obstetrics and Gynecology | Admitting: Obstetrics and Gynecology

## 2022-03-25 VITALS — BP 111/82 | HR 71 | Ht 66.0 in | Wt 120.0 lb

## 2022-03-25 DIAGNOSIS — Z30013 Encounter for initial prescription of injectable contraceptive: Secondary | ICD-10-CM

## 2022-03-25 DIAGNOSIS — Z01419 Encounter for gynecological examination (general) (routine) without abnormal findings: Secondary | ICD-10-CM | POA: Diagnosis present

## 2022-03-25 DIAGNOSIS — Z3202 Encounter for pregnancy test, result negative: Secondary | ICD-10-CM

## 2022-03-25 DIAGNOSIS — N941 Unspecified dyspareunia: Secondary | ICD-10-CM | POA: Diagnosis not present

## 2022-03-25 DIAGNOSIS — Z0000 Encounter for general adult medical examination without abnormal findings: Secondary | ICD-10-CM | POA: Diagnosis not present

## 2022-03-25 LAB — POCT URINALYSIS DIP (DEVICE)
Bilirubin Urine: NEGATIVE
Glucose, UA: NEGATIVE mg/dL
Hgb urine dipstick: NEGATIVE
Ketones, ur: NEGATIVE mg/dL
Leukocytes,Ua: NEGATIVE
Nitrite: NEGATIVE
Protein, ur: NEGATIVE mg/dL
Specific Gravity, Urine: 1.025 (ref 1.005–1.030)
Urobilinogen, UA: 0.2 mg/dL (ref 0.0–1.0)
pH: 5.5 (ref 5.0–8.0)

## 2022-03-25 LAB — POCT PREGNANCY, URINE: Preg Test, Ur: NEGATIVE

## 2022-03-25 MED ORDER — MEDROXYPROGESTERONE ACETATE 150 MG/ML IM SUSP: 150.0000 mg | Freq: Once | INTRAMUSCULAR | Status: DC

## 2022-03-25 NOTE — Progress Notes (Signed)
ANNUAL EXAM Patient name: Makayla Russell MRN [id number]  Date of birth: [birth year] Chief Complaint:   Gynecologic Exam  History of Present Illness:   Makayla Russell is a 36 y.o. [phone] female being seen today for a routine annual exam.   She reports she has menses that are kind of heavy, last a few days. She is not trying to prevent pregnancy at this time. No nipple or breast changes. She is interested in resuming depo provera but just for the weight gain. She states she has lost about 10-12 lbs within the last few months without trying. No skin or bowel changes. Feels that her under yes are dark which is new for her. She feels like she is typically cold, denies hot flashes. History of mild constipation, last stool was not hard to pass though. No changes in stool caliber. No blood in stool; once had blood seen on urine, was told to follow up with urology but missed her appointment. At the time of urine collection she was finishing up her menses. Currently sexually active with occasional discomfort.   Hx of upper abdominal pain with negative endoscopy. Hx of left ovarian vein non-occlusive thrombus.    She is interested in starting depo provera for weight gain. She would get it today if urine pregnancy negative. She is not sure she wants to do anything to prevent pregnancy but wants to gain weight as well.   Patient's last menstrual period was 02/18/2022.   The pregnancy intention screening data noted above was reviewed. Potential methods of contraception were discussed.   Last pap 04/2018. Results were: NILM w/ HRHPV negative.  Last mammogram: n/a Last colonoscopy: n/a     02/25/2022    1:09 PM 12/24/2020    4:20 PM 05/13/2020    3:42 PM 08/22/2017    3:45 PM  Depression screen PHQ 2/9  Decreased Interest 3 0 0 1  Down, Depressed, Hopeless 3 0 0 0  PHQ - 2 Score 6 0 0 1  Altered sleeping 0 0 0   Tired, decreased energy 0 1 2   Change in appetite 0 1 0   Feeling bad or failure about  yourself  0 0 0   Trouble concentrating 0 1 2   Moving slowly or fidgety/restless 0 0 0   Suicidal thoughts 0 0 0   PHQ-9 Score 6 3 4    Difficult doing work/chores Somewhat difficult           12/24/2020    4:20 PM 05/13/2020    3:43 PM 08/22/2017    3:45 PM  GAD 7 : Generalized Anxiety Score  Nervous, Anxious, on Edge 0 0 2  Control/stop worrying 1 0 2  Worry too much - different things 1 0 2  Trouble relaxing 1 2 1   Restless 0 0 0  Easily annoyed or irritable 1  3  Afraid - awful might happen 0 0 1  Total GAD 7 Score 4  11     Review of Systems:   Pertinent items are noted in HPI Denies any headaches, blurred vision, fatigue, shortness of breath, chest pain, abdominal pain, abnormal vaginal discharge/itching/odor/irritation, problems with periods, bowel movements, urination, or intercourse unless otherwise stated above. Pertinent History Reviewed:  Reviewed past medical,surgical, social and family history.  Reviewed problem list, medications and allergies. Physical Assessment:  There were no vitals filed for this visit.There is no height or weight on file to calculate BMI.        Physical  Examination:   General appearance - well appearing, and in no distress  Mental status - alert, oriented to person, place, and time  Psych:  She has a normal mood and affect  Skin - warm and dry, normal color, no suspicious lesions noted  Chest - effort normal, all lung fields clear to auscultation bilaterally  Heart - normal rate and regular rhythm  Breasts - breasts appear normal, no suspicious masses, no skin or nipple changes or  axillary nodes  Abdomen - soft, nontender, nondistended, no masses or organomegaly  Pelvic -  VULVA: normal appearing vulva with no masses, tenderness or lesions   VAGINA: normal appearing vagina with normal color and discharge, no lesions   CERVIX: normal appearing cervix without discharge or lesions, no CMT, ? [place] cyst, no masses or lesions  Thin prep  pap is done with HR HPV cotesting  Extremities:  No swelling or varicosities noted  Chaperone present for exam  No results found for this or any previous visit (from the past 24 hour(s)).   Assessment & Plan:  1. Well woman exam with routine gynecological exam Routine screening, follow up pap . Referral to nutrition due to desire to gain weight - Cytology - PAP( [facility]) - Hepatitis B Surface AntiGEN - Hepatitis C Antibody - Referral to Nutrition and Diabetes Services  2. Encounter for initial prescription of injectable contraceptive Initially desired depo but decided not to have done and will return for RN visit if she changes her mind. Depo primarily for weight gain but also does not want to prevent pregnancy at this time.   3. Dyspareunia in female Pelvic ultrasound and discuss further at followup.  - US PELVIC COMPLETE WITH TRANSVAGINAL; Future   Orders Placed This Encounter  Procedures   US PELVIC COMPLETE WITH TRANSVAGINAL   Hepatitis B Surface AntiGEN   Hepatitis C Antibody   Referral to Nutrition and Diabetes Services   Pregnancy, urine POC   POCT urinalysis dip (device)    Meds: No orders of the defined types were placed in this encounter.   Follow-up: No follow-ups on file.  Lorriane Shire, MD 03/25/2022 1:00 PM

## 2022-03-25 NOTE — Patient Instructions (Signed)
IUD Nexplanon Depo  Birth control pills

## 2022-03-26 LAB — HEPATITIS C ANTIBODY: Hep C Virus Ab: NONREACTIVE

## 2022-03-26 LAB — HEPATITIS B SURFACE ANTIGEN: Hepatitis B Surface Ag: NEGATIVE

## 2022-03-29 ENCOUNTER — Other Ambulatory Visit: Payer: Self-pay | Admitting: Obstetrics and Gynecology

## 2022-03-29 ENCOUNTER — Encounter: Payer: Self-pay | Admitting: Obstetrics and Gynecology

## 2022-03-29 DIAGNOSIS — A599 Trichomoniasis, unspecified: Secondary | ICD-10-CM

## 2022-03-29 LAB — CYTOLOGY - PAP
Chlamydia: NEGATIVE
Comment: NEGATIVE
Comment: NEGATIVE
Comment: NEGATIVE
Comment: NORMAL
High risk HPV: NEGATIVE
Neisseria Gonorrhea: NEGATIVE
Trichomonas: POSITIVE — AB

## 2022-03-29 MED ORDER — METRONIDAZOLE 500 MG PO TABS: 500.0000 mg | ORAL_TABLET | Freq: Two times a day (BID) | ORAL | 0 refills | Status: AC

## 2022-04-08 ENCOUNTER — Ambulatory Visit: Payer: Medicaid Other | Admitting: Nurse Practitioner

## 2022-04-14 ENCOUNTER — Ambulatory Visit: Payer: Medicaid Other

## 2022-08-21 ENCOUNTER — Ambulatory Visit
Admission: RE | Admit: 2022-08-21 | Discharge: 2022-08-21 | Disposition: A | Discharge status: Home / Self Care | Payer: No Typology Code available for payment source | Source: Ambulatory Visit | Attending: Urgent Care | Admitting: Urgent Care

## 2022-08-21 VITALS — BP 117/78 | HR 90 | Temp 98.2°F | Resp 16

## 2022-08-21 DIAGNOSIS — Z113 Encounter for screening for infections with a predominantly sexual mode of transmission: Secondary | ICD-10-CM | POA: Diagnosis present

## 2022-08-21 DIAGNOSIS — Z7251 High risk heterosexual behavior: Secondary | ICD-10-CM | POA: Insufficient documentation

## 2022-08-21 NOTE — ED Provider Notes (Signed)
[facility]  Note:  This document was prepared using Conservation officer, historic buildings and may include unintentional dictation errors.  MRN: [id number] DOB: 11/04/1985  Subjective:   Makayla Russell is a 37 y.o. female presenting for STD check. Denies fever, n/v, abdominal pain, pelvic pain, rashes, dysuria, urinary frequency, hematuria, vaginal discharge.  She did have unprotected sex 5 days ago and wants to make sure she gets checked.  No current facility-administered medications for this encounter.  Current Outpatient Medications:    albuterol (ACCUNEB) 1.25 MG/3ML nebulizer solution, Take 1 ampule by nebulization every 6 (six) hours as needed for wheezing., Disp: , Rfl:    ascorbic acid (VITAMIN C) 500 MG tablet, Take by mouth. (Patient not taking: Reported on 03/25/2022), Disp: , Rfl:    ferrous sulfate 325 (65 FE) MG tablet, Take 1 tablet by mouth daily with breakfast. (Patient not taking: Reported on 03/15/2022), Disp: , Rfl:    No Known Allergies  Past Medical History:  Diagnosis Date   Abortion    x 2   Anemia    hx of   Asthma    uses inhaler PRN   Blood transfusion without reported diagnosis 2021   x 2    Clot    in ovary vein 2018   Depression    hx of   Dysplasia of cervix    colposcopy   GERD (gastroesophageal reflux disease)    uses OTC PRN   History of stomach ulcers      Past Surgical History:  Procedure Laterality Date   DILATION AND CURETTAGE OF UTERUS     INDUCED ABORTION     IR RADIOLOGIST EVAL & MGMT  03/19/2021   IR RADIOLOGIST EVAL & MGMT  04/16/2021   IR RADIOLOGIST EVAL & MGMT  08/14/2021   IR RADIOLOGIST EVAL & MGMT  10/27/2021    Family History  Problem Relation Age of Onset   Hypertension Mother    Tuberculosis Maternal Grandmother    Colon cancer Neg Hx    Colon polyps Neg Hx    Esophageal cancer Neg Hx    Rectal cancer Neg Hx    Stomach cancer Neg Hx     Social History   Tobacco Use   Smoking status:  Never   Smokeless tobacco: Never  Vaping Use   Vaping Use: Never used  Substance Use Topics   Alcohol use: Yes    Comment: 1-2 times per month   Drug use: No    Comment: past hx of marijuana use     ROS   Objective:   Vitals: BP 117/78 (BP Location: Right Arm)   Pulse 90   Temp 98.2 F (36.8 C) (Oral)   Resp 16   LMP 08/18/2022 (Exact Date)   SpO2 98%   Physical Exam Constitutional:      General: She is not in acute distress.    Appearance: Normal appearance. She is well-developed. She is not ill-appearing, toxic-appearing or diaphoretic.  HENT:     Head: Normocephalic and atraumatic.     Nose: Nose normal.     Mouth/Throat:     Mouth: Mucous membranes are moist.  Eyes:     General: No scleral icterus.       Right eye: No discharge.        Left eye: No discharge.     Extraocular Movements: Extraocular movements intact.  Cardiovascular:     Rate and Rhythm: Normal rate.  Pulmonary:  Effort: Pulmonary effort is normal.  Skin:    General: Skin is warm and dry.  Neurological:     General: No focal deficit present.     Mental Status: She is alert and oriented to person, place, and time.  Psychiatric:        Mood and Affect: Mood normal.        Behavior: Behavior normal.     Assessment and Plan :   PDMP not reviewed this encounter.  1. Unprotected sex   2. Screen for STD (sexually transmitted disease)    Will base treatment off of results.   Wallis Bamberg, PA-C 08/21/22 1020

## 2022-08-21 NOTE — ED Triage Notes (Signed)
Pt requested STD's test after unprotected sex.

## 2022-08-24 LAB — HIV ANTIBODY (ROUTINE TESTING W REFLEX): HIV Screen 4th Generation wRfx: NONREACTIVE

## 2022-08-24 LAB — CERVICOVAGINAL ANCILLARY ONLY
Chlamydia: NEGATIVE
Comment: NEGATIVE
Comment: NEGATIVE
Comment: NORMAL
Neisseria Gonorrhea: NEGATIVE
Trichomonas: NEGATIVE

## 2022-08-24 LAB — RPR: RPR Ser Ql: NONREACTIVE

## 2023-05-13 IMAGING — CT CT VENOGRAM ABD-PELV
2 of 11 series · 13 of 46 positions shown, 17 images · IV contrast (OMNIPAQUE)
Comparison: 07/24/2017

CLINICAL DATA: Chronic pelvic pain worse on the left, previous
imaging findings concerning for pelvic congestion syndrome.

EXAM:
CTA ABDOMEN AND PELVIS WITH CONTRAST (venogram protocol)
TECHNIQUE: Multidetector CT imaging of the abdomen and pelvis was performed
using the standard protocol during bolus administration of
intravenous contrast. Multiplanar reconstructed images and MIPs were
obtained and reviewed to evaluate the vascular anatomy. Arterial and
delayed portal venous phase imaging performed through the abdomen
and pelvis as a CT venogram protocol.
CONTRAST:  100mL OMNIPAQUE IOHEXOL 350 MG/ML SOLN

[Series 3: coronals portal venous · coronal · portal-venous · 1.00mm/px · 2 of 211 slices shown]
[im 71/211  soft-tissue]
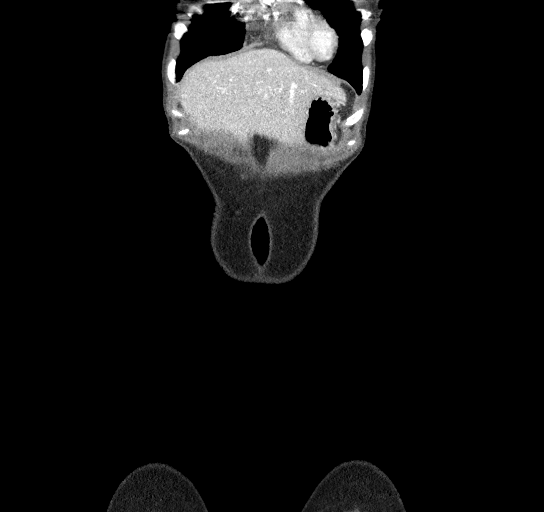
[im 141/211  soft-tissue]
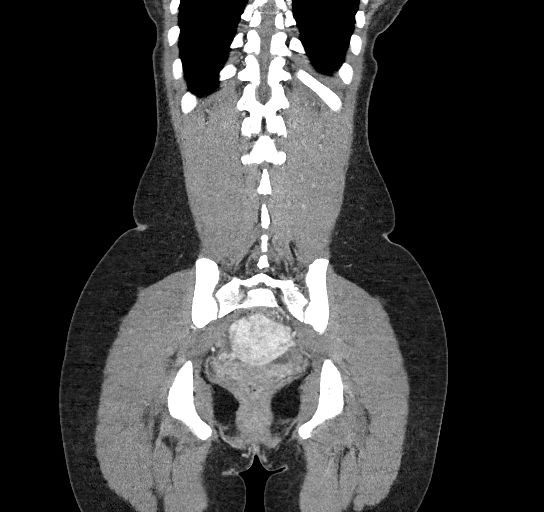

[Series 12: venous thins · axial · portal-venous · 0.72mm/px · z∈[-528,-97]mm · 11 of 853 slices shown, 15 images]
[im 90/853  soft-tissue]
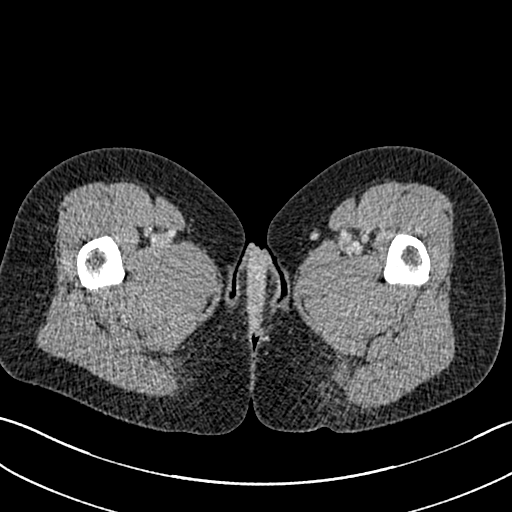
[im 90/853  bone]
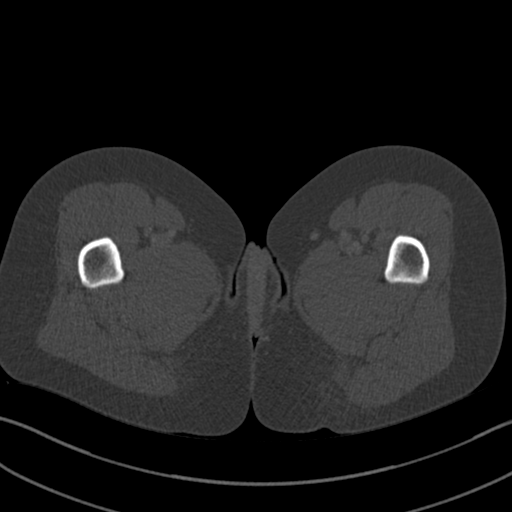
[im 180/853  soft-tissue]
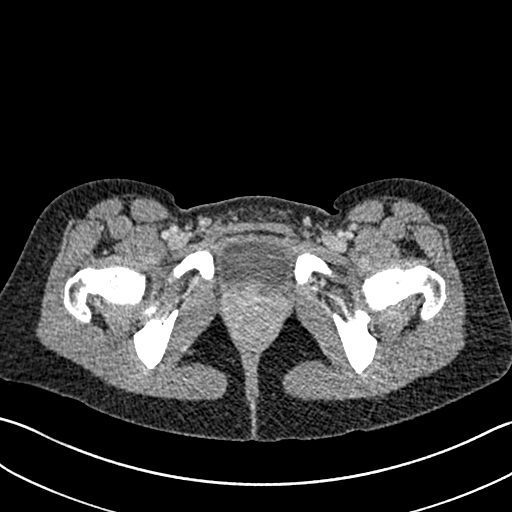
[im 270/853  soft-tissue]
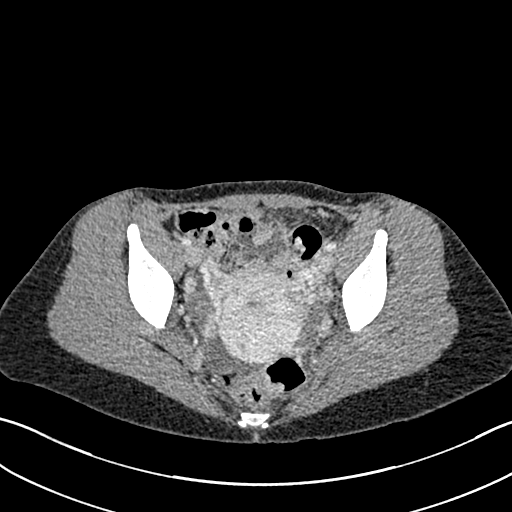
[im 359/853  soft-tissue]
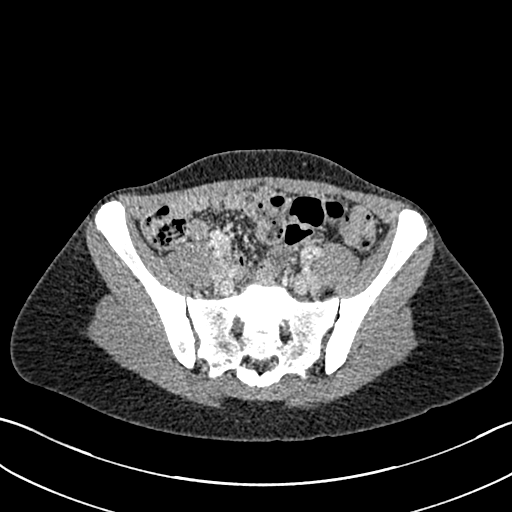
[im 449/853  soft-tissue]
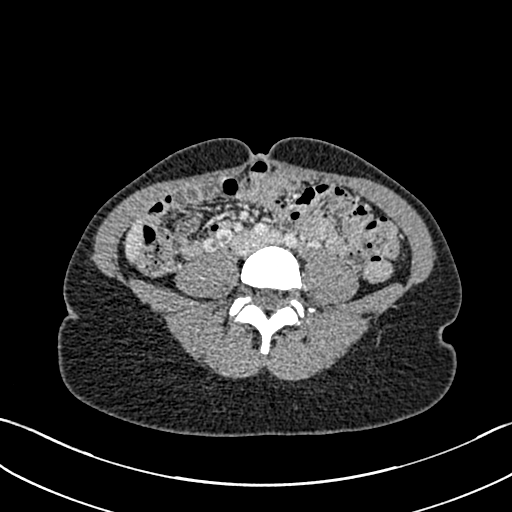
[im 539/853  soft-tissue]
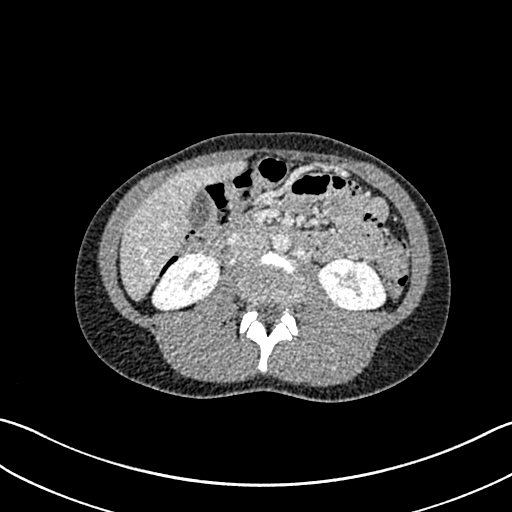
[im 628/853  soft-tissue]
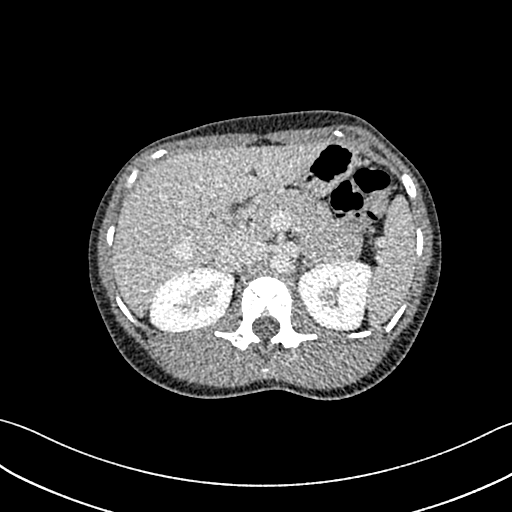
[im 673/853  lung]
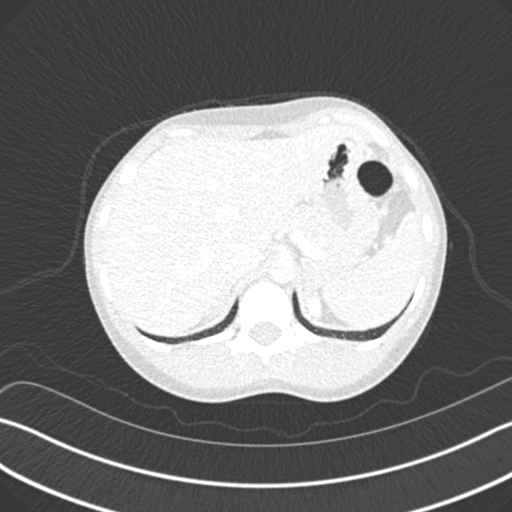
[im 718/853  soft-tissue]
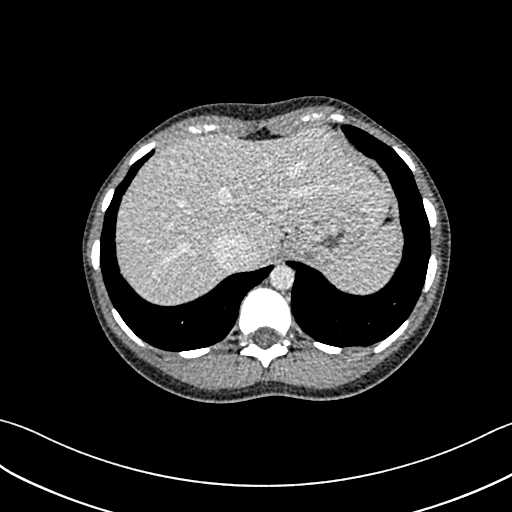
[im 718/853  lung]
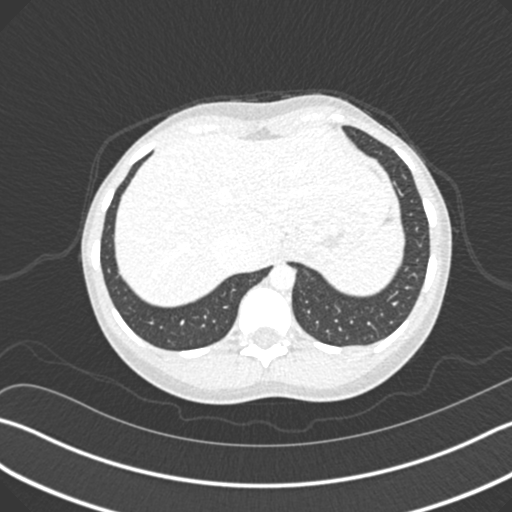
[im 763/853  lung]
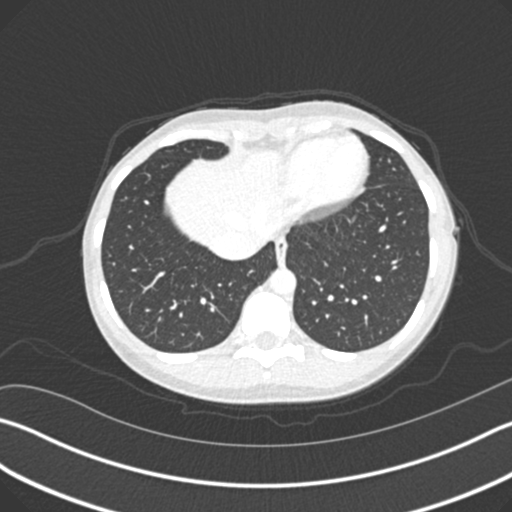
[im 808/853  soft-tissue]
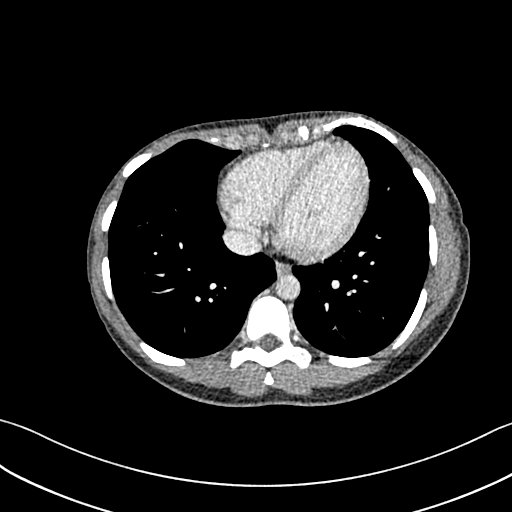
[im 808/853  lung]
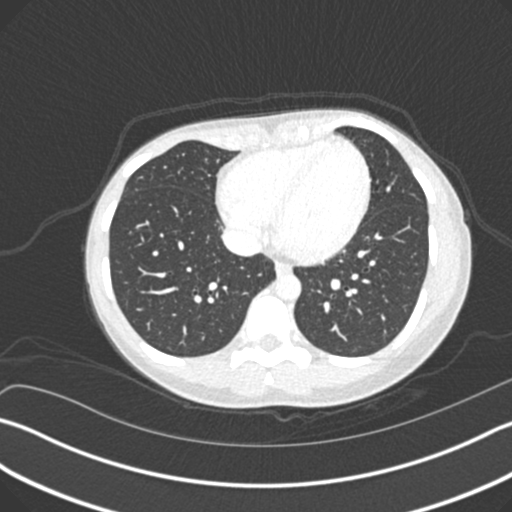
[im 808/853  bone]
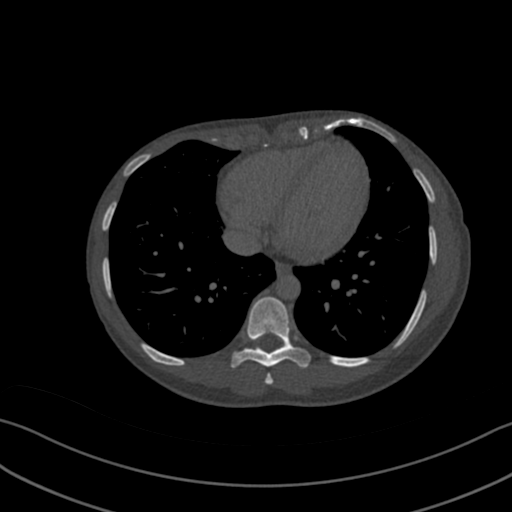

[13 of 46 positions shown; findings below may reference images not displayed]

FINDINGS: VASCULAR

Aorta: Normal caliber aorta without aneurysm, dissection, vasculitis
or significant stenosis.

Celiac: Widely patent origin including its branches proximally.

SMA: Widely patent origin including its branches in the central
mesentery.

Renals: Main renal arteries remain patent including an accessory
left renal artery.

IMA: Remains patent off the distal aorta including its branches.

Inflow: Patent without evidence of aneurysm, dissection, vasculitis
or significant stenosis.

Proximal Outflow: Bilateral common femoral and visualized portions
of the superficial and profunda femoral arteries are patent without
evidence of aneurysm, dissection, vasculitis or significant
stenosis.

Veins: Delayed venous phase imaging performed as a CT venogram
protocol.

Hepatic, portal, splenic, mesenteric, and renal veins all appear
patent. Nonspecific mildly dilated IVC without occlusion or
thrombus. Pelvic iliac veins and visualized femoral veins are all
patent.

Ovarian veins appear prominent to mildly dilated bilaterally, worse
on the right. There is a small amount of nonocclusive thrombus
within the dilated right ovarian vein.

There is slight increased interval distention or enlargement of the
pelvic ovarian and uterine veins surrounding the uterus and cervix.
Imaging findings are compatible with pelvic congestion syndrome.

Review of the MIP images confirms the above findings.

NON-VASCULAR

Lower chest: No acute abnormality.

Hepatobiliary: No focal liver abnormality is seen. No gallstones,
gallbladder wall thickening, or biliary dilatation.

Pancreas: Unremarkable. No pancreatic ductal dilatation or
surrounding inflammatory changes.

Spleen: Normal in size without focal abnormality.

Adrenals/Urinary Tract: Adrenal glands are unremarkable. Kidneys are
normal, without renal calculi, focal lesion, or hydronephrosis.
Bladder is unremarkable.

Stomach/Bowel: Stomach is within normal limits. Appendix appears
normal. No evidence of bowel wall thickening, distention, or
inflammatory changes.

Lymphatic: No adenopathy

Reproductive: Retroverted uterus, normal in size. Similar
nonspecific heterogeneous uterine enhancement. Small amount of
pelvic free fluid, suspect physiologic. No fluid collection or
hematoma.

Other: No abdominal wall hernia or abnormality. No abdominopelvic
ascites.

Musculoskeletal: No acute or significant osseous findings.
IMPRESSION: VASCULAR

Enlarged dilated right ovarian vein and mildly dilated left ovarian
vein. Right ovarian vein has nonocclusive thrombus. Associated
increased distension/enlargement of the pelvic ovarian and uterine
venous complex bilaterally compatible with pelvic congestion
syndrome. This does appear slightly worse compared to 2189 exam.

NON-VASCULAR

No other acute intra-abdominal or pelvic finding

## 2024-04-24 ENCOUNTER — Other Ambulatory Visit: Payer: Self-pay

## 2024-04-24 ENCOUNTER — Encounter (HOSPITAL_COMMUNITY): Payer: Self-pay

## 2024-04-24 ENCOUNTER — Emergency Department (HOSPITAL_COMMUNITY): Admission: EM | Admit: 2024-04-24 | Discharge: 2024-04-24 | Discharge status: Against Medical Advice | Payer: Self-pay

## 2024-04-24 LAB — BASIC METABOLIC PANEL WITH GFR
Anion gap: 9 (ref 5–15)
BUN: 9 mg/dL (ref 6–20)
CO2: 23 mmol/L (ref 22–32)
Calcium: 9.2 mg/dL (ref 8.9–10.3)
Chloride: 108 mmol/L (ref 98–111)
Creatinine, Ser: 0.8 mg/dL (ref 0.44–1.00)
GFR, Estimated: >60 mL/min (ref >60)
Glucose, Bld: 109 mg/dL — ABNORMAL HIGH (ref 70–99)
Potassium: 3.2 mmol/L — ABNORMAL LOW (ref 3.5–5.1)
Sodium: 139 mmol/L (ref 135–145)

## 2024-04-24 LAB — CBC
HCT: 32.5 % — ABNORMAL LOW (ref 36.0–46.0)
Hemoglobin: 10.6 g/dL — ABNORMAL LOW (ref 12.0–15.0)
MCH: 31 pg (ref 26.0–34.0)
MCHC: 32.6 g/dL (ref 30.0–36.0)
MCV: 95 fL (ref 80.0–100.0)
Platelets: 305 K/uL (ref 150–400)
RBC: 3.42 MIL/uL — ABNORMAL LOW (ref 3.87–5.11)
RDW: 13.6 % (ref 11.5–15.5)
WBC: 5.4 K/uL (ref 4.0–10.5)
nRBC: 0 % (ref 0.0–0.2)

## 2024-04-24 LAB — HCG, SERUM, QUALITATIVE: Preg, Serum: NEGATIVE

## 2024-04-24 NOTE — ED Provider Triage Note (Addendum)
 Emergency Medicine Provider Triage Evaluation Note  Makayla Russell , a 38 y.o. female  was evaluated in triage.  Pt complains of headache.  Patient describes a tension headache to the posterior aspect of her head that has been nearly constant for almost 1 month, states it does come and go but is worse in intensity today.  She does not take anything for management of her symptoms as she does not like to take medicine.  Reports that she attempted to check my pupils yesterday and states that they were not dilating with a light, denies vision changes or dizziness.  Denies fever or neck pain, does not take any medications daily.  Review of Systems  Positive: As above Negative: As above  Physical Exam  BP 121/71 (BP Location: Right Arm)   Pulse 86   Temp 98.3 F (36.8 C) (Oral)   Resp 15   Ht 5' 7 (1.702 m)   Wt 59 kg   LMP 04/24/2024 (Approximate)   SpO2 100%   BMI 20.36 kg/m  Gen:   Awake, no distress   Resp:  Normal effort  MSK:   Moves extremities without difficulty  Other:  PERRLA, EOM's in-tact, no focal neurologic deficits  Medical Decision Making  Medically screening exam initiated at 5:01 PM.  Appropriate orders placed.  Makayla Russell was informed that the remainder of the evaluation will be completed by another provider, this initial triage assessment does not replace that evaluation, and the importance of remaining in the ED until their evaluation is complete.        Makayla Russell SAILOR, [place] 04/24/24 1702

## 2024-04-24 NOTE — ED Notes (Signed)
 Patient left.

## 2024-04-24 NOTE — ED Triage Notes (Signed)
 C/O tension headache in the middle back of head. Denies blurred vision/numbness/tingling. Axox4.
